# Patient Record
Sex: Male | Born: 1970
Health system: Southern US, Community
[De-identification: ages and names within clinical notes are randomized; demographics above are authoritative.]

---

## 2016-02-21 DIAGNOSIS — L812 Freckles: Secondary | ICD-10-CM | POA: Diagnosis not present

## 2016-02-21 DIAGNOSIS — L219 Seborrheic dermatitis, unspecified: Secondary | ICD-10-CM | POA: Diagnosis not present

## 2016-02-21 DIAGNOSIS — L84 Corns and callosities: Secondary | ICD-10-CM | POA: Diagnosis not present

## 2016-02-21 DIAGNOSIS — L700 Acne vulgaris: Secondary | ICD-10-CM | POA: Diagnosis not present

## 2016-02-21 DIAGNOSIS — L821 Other seborrheic keratosis: Secondary | ICD-10-CM | POA: Diagnosis not present

## 2016-03-24 DIAGNOSIS — K219 Gastro-esophageal reflux disease without esophagitis: Secondary | ICD-10-CM | POA: Diagnosis not present

## 2016-04-13 DIAGNOSIS — K219 Gastro-esophageal reflux disease without esophagitis: Secondary | ICD-10-CM | POA: Diagnosis not present

## 2016-06-21 ENCOUNTER — Other Ambulatory Visit: Payer: Self-pay | Admitting: Gastroenterology

## 2016-06-21 DIAGNOSIS — R1013 Epigastric pain: Secondary | ICD-10-CM

## 2016-06-30 ENCOUNTER — Ambulatory Visit
Admission: RE | Admit: 2016-06-30 | Discharge: 2016-06-30 | Disposition: A | Discharge status: Home / Self Care | Payer: BLUE CROSS/BLUE SHIELD | Source: Ambulatory Visit | Attending: Gastroenterology | Admitting: Gastroenterology

## 2016-06-30 DIAGNOSIS — R1013 Epigastric pain: Secondary | ICD-10-CM | POA: Diagnosis not present

## 2016-06-30 IMAGING — RF DG UGI W/ HIGH DENSITY W/KUB
19 of 24 series · 19 of 24 positions shown · non-contrast
Comparison: None.

CLINICAL DATA: Epigastric pain

EXAM:
UPPER GI SERIES WITH KUB
TECHNIQUE: After obtaining a scout radiograph a routine upper GI series was
performed using thin and high density barium.
FLUOROSCOPY TIME:  Radiation Exposure Index (as provided by the
fluoroscopic device): 130 through d Gy cm2
If the device does not provide the exposure index:
Fluoroscopy Time (in minutes and seconds):  2 minutes 40 seconds
Number of Acquired Images:  Days

[Series 1: run · 1 of 5 slices shown (1 of 19)]
[im 1/5]
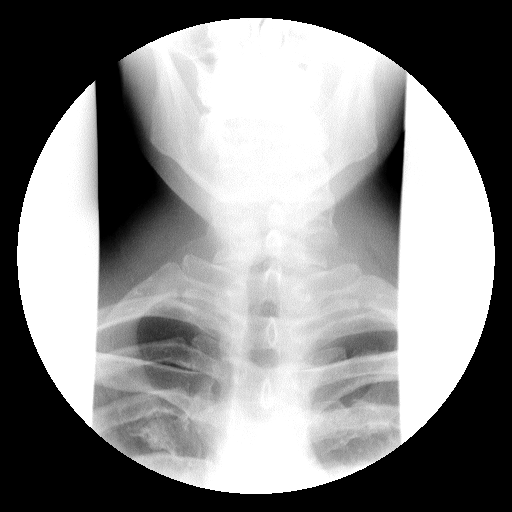

[Series 2: run · 1 of 4 slices shown (2 of 19)]
[im 1/4]
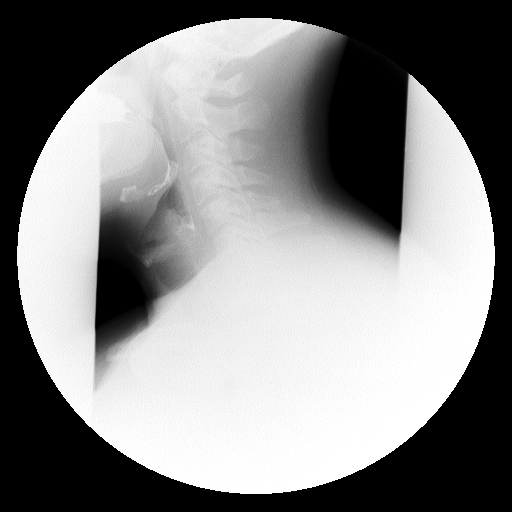

[Series 4: run · 1 of 1 slices shown (3 of 19)]
[im 1/1]
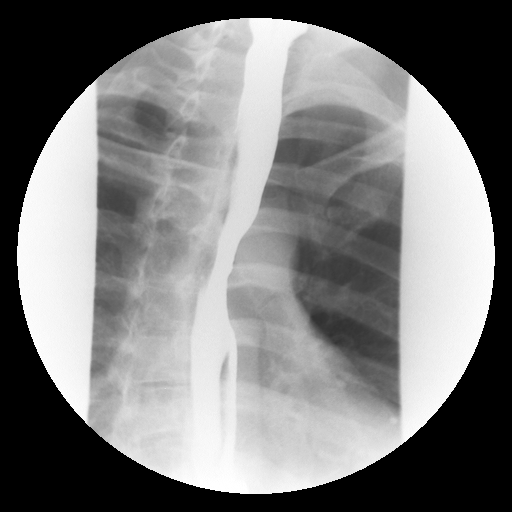

[Series 5: run · 1 of 1 slices shown (4 of 19)]
[im 1/1]
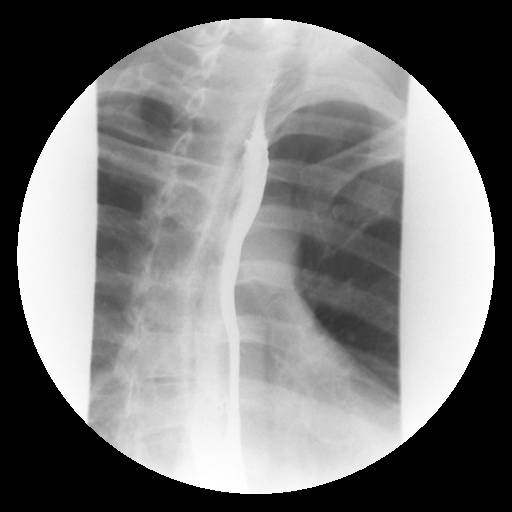

[Series 6: run · 1 of 1 slices shown (5 of 19)]
[im 1/1]
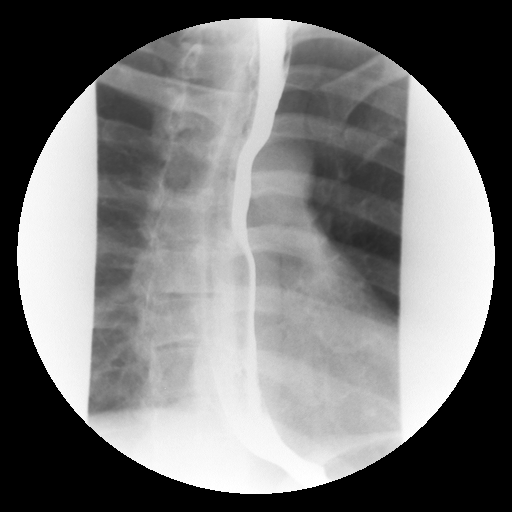

[Series 7: run · 1 of 1 slices shown (6 of 19)]
[im 1/1]
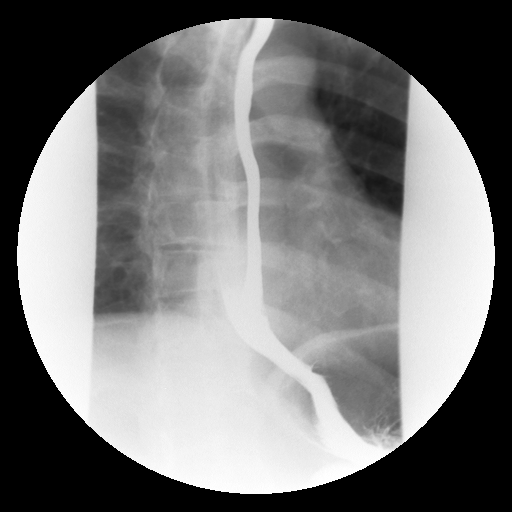

[Series 9: run · 1 of 1 slices shown (7 of 19)]
[im 1/1]
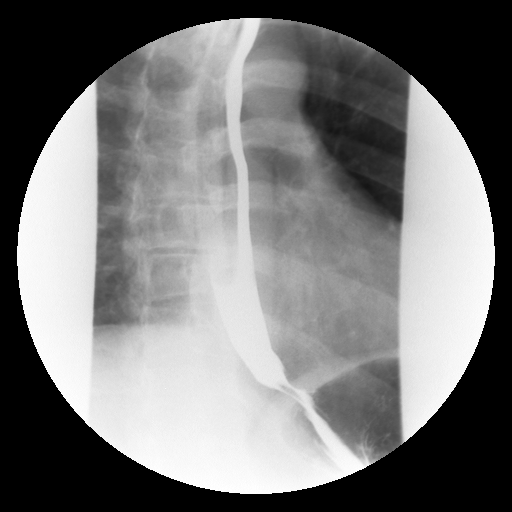

[Series 10: run · 1 of 1 slices shown (8 of 19)]
[im 1/1]
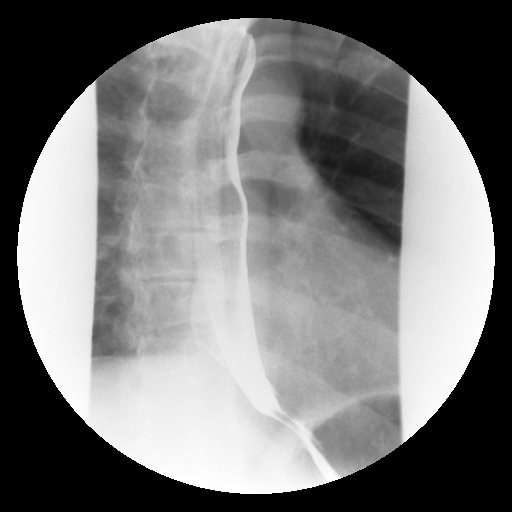

[Series 11: run · 1 of 1 slices shown (9 of 19)]
[im 1/1]
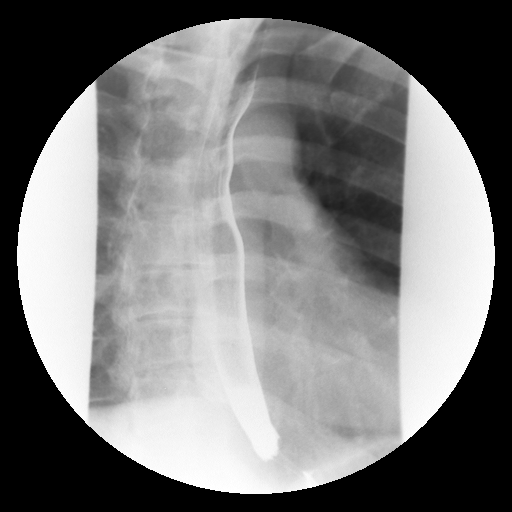

[Series 13: run · 1 of 1 slices shown (10 of 19)]
[im 1/1]
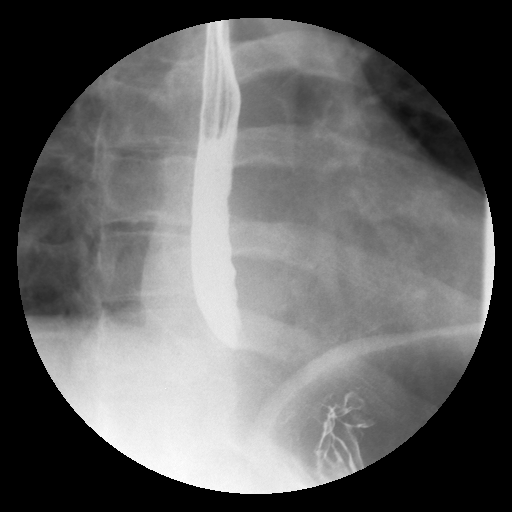

[Series 14: run · 1 of 1 slices shown (11 of 19)]
[im 1/1]
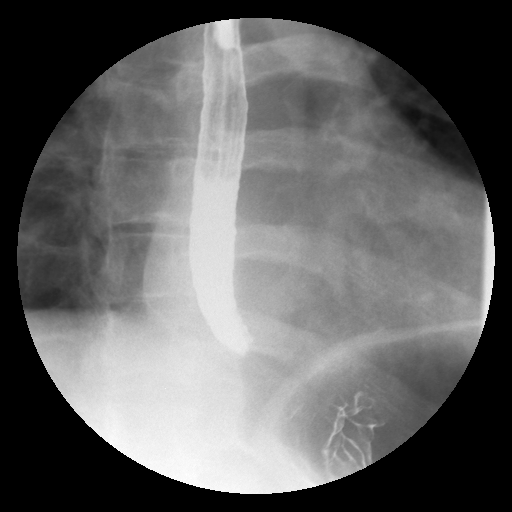

[Series 15: run · 1 of 2 slices shown (12 of 19)]
[im 1/2]
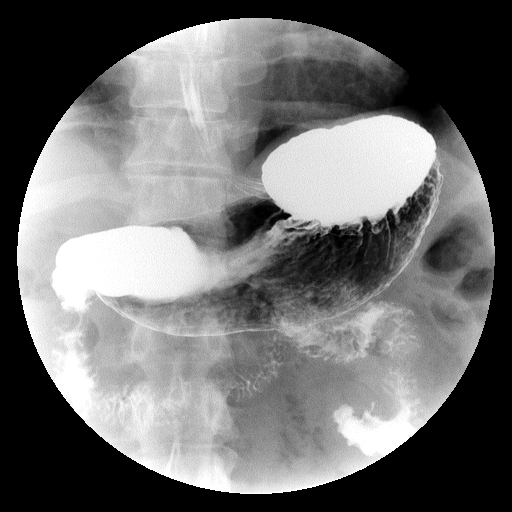

[Series 16: run · 1 of 1 slices shown (13 of 19)]
[im 1/1]
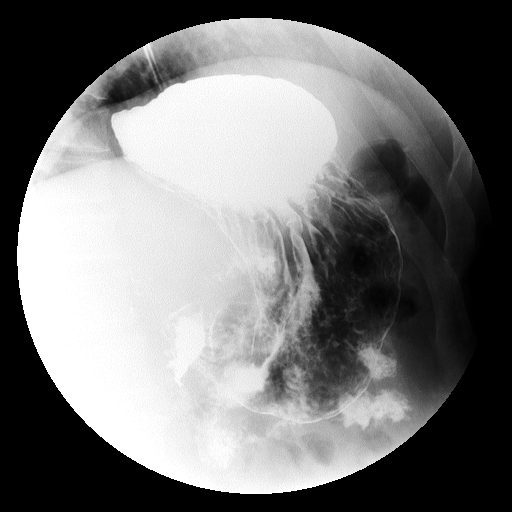

[Series 18: run · 1 of 1 slices shown (14 of 19)]
[im 1/1]
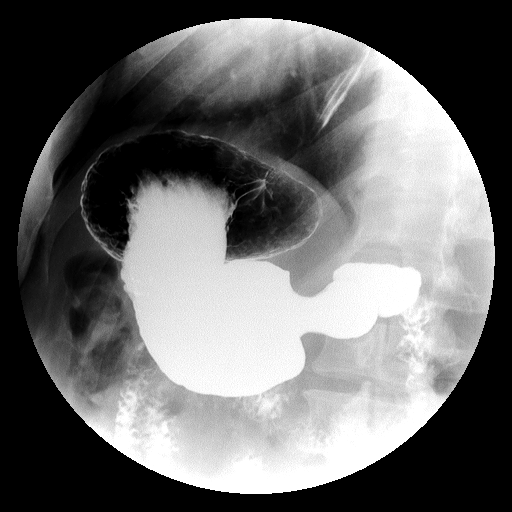

[Series 19: run · 1 of 1 slices shown (15 of 19)]
[im 1/1]
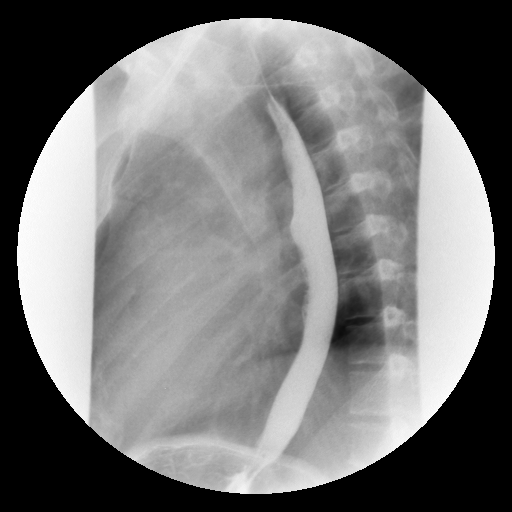

[Series 20: run · 1 of 1 slices shown (16 of 19)]
[im 1/1]
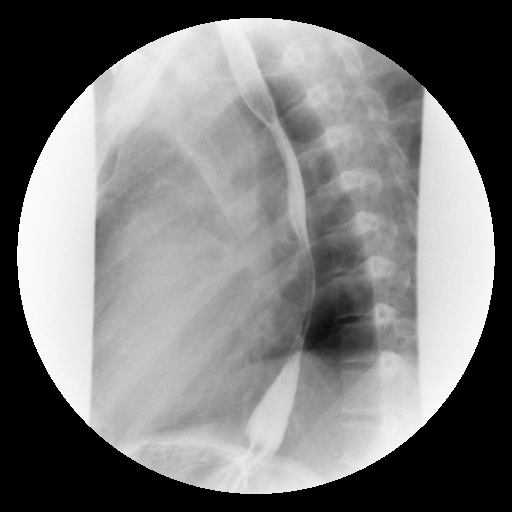

[Series 21: run · 1 of 1 slices shown (17 of 19)]
[im 1/1]
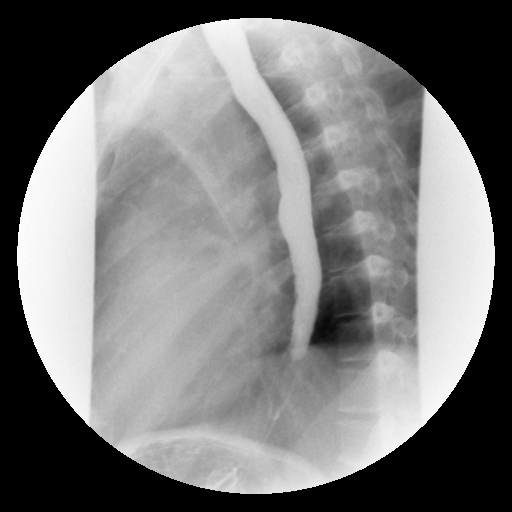

[Series 23: run · 1 of 1 slices shown (18 of 19)]
[im 1/1]
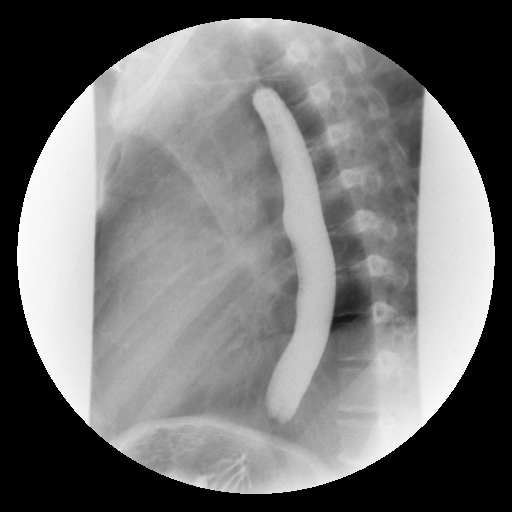

[Series 24: run · 1 of 1 slices shown (19 of 19)]
[im 1/1]
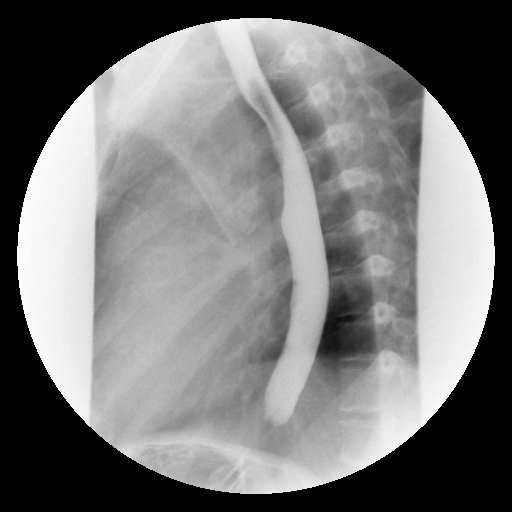

[19 of 24 positions shown; findings below may reference images not displayed]

FINDINGS: There is disruption [DATE] primary esophageal peristaltic
waves. No fixed stricture, fold thickening or mass. No reflux with
the water siphon maneuver. The patient swallowed a 13 mm barium
tablet which freely passed into the stomach.

The stomach, duodenal bulb and duodenal sweep are unremarkable. No
fold thickening, mass or ulceration.
IMPRESSION: Nonspecific esophageal motility disorder.

Otherwise unremarkable upper GI with barium swallow.

## 2017-02-06 DIAGNOSIS — R12 Heartburn: Secondary | ICD-10-CM | POA: Diagnosis not present

## 2017-02-06 DIAGNOSIS — Z0000 Encounter for general adult medical examination without abnormal findings: Secondary | ICD-10-CM | POA: Diagnosis not present

## 2017-02-06 DIAGNOSIS — Z13220 Encounter for screening for lipoid disorders: Secondary | ICD-10-CM | POA: Diagnosis not present

## 2017-02-21 DIAGNOSIS — L821 Other seborrheic keratosis: Secondary | ICD-10-CM | POA: Diagnosis not present

## 2017-02-21 DIAGNOSIS — R1013 Epigastric pain: Secondary | ICD-10-CM | POA: Diagnosis not present

## 2017-02-21 DIAGNOSIS — K219 Gastro-esophageal reflux disease without esophagitis: Secondary | ICD-10-CM | POA: Diagnosis not present

## 2017-02-21 DIAGNOSIS — L814 Other melanin hyperpigmentation: Secondary | ICD-10-CM | POA: Diagnosis not present

## 2017-02-21 DIAGNOSIS — D225 Melanocytic nevi of trunk: Secondary | ICD-10-CM | POA: Diagnosis not present

## 2017-02-23 DIAGNOSIS — K219 Gastro-esophageal reflux disease without esophagitis: Secondary | ICD-10-CM | POA: Diagnosis not present

## 2017-02-23 DIAGNOSIS — R1013 Epigastric pain: Secondary | ICD-10-CM | POA: Diagnosis not present

## 2018-02-21 DIAGNOSIS — S46812A Strain of other muscles, fascia and tendons at shoulder and upper arm level, left arm, initial encounter: Secondary | ICD-10-CM | POA: Diagnosis not present

## 2018-02-21 DIAGNOSIS — Z0000 Encounter for general adult medical examination without abnormal findings: Secondary | ICD-10-CM | POA: Diagnosis not present

## 2018-02-21 DIAGNOSIS — Z91038 Other insect allergy status: Secondary | ICD-10-CM | POA: Diagnosis not present

## 2018-02-21 DIAGNOSIS — Z13220 Encounter for screening for lipoid disorders: Secondary | ICD-10-CM | POA: Diagnosis not present

## 2018-03-13 DIAGNOSIS — S46812D Strain of other muscles, fascia and tendons at shoulder and upper arm level, left arm, subsequent encounter: Secondary | ICD-10-CM | POA: Diagnosis not present

## 2018-03-13 DIAGNOSIS — M546 Pain in thoracic spine: Secondary | ICD-10-CM | POA: Diagnosis not present

## 2018-03-14 DIAGNOSIS — D1801 Hemangioma of skin and subcutaneous tissue: Secondary | ICD-10-CM | POA: Diagnosis not present

## 2018-03-14 DIAGNOSIS — L814 Other melanin hyperpigmentation: Secondary | ICD-10-CM | POA: Diagnosis not present

## 2018-03-14 DIAGNOSIS — L819 Disorder of pigmentation, unspecified: Secondary | ICD-10-CM | POA: Diagnosis not present

## 2018-03-14 DIAGNOSIS — L570 Actinic keratosis: Secondary | ICD-10-CM | POA: Diagnosis not present

## 2018-03-14 DIAGNOSIS — L738 Other specified follicular disorders: Secondary | ICD-10-CM | POA: Diagnosis not present

## 2018-03-22 DIAGNOSIS — S46812D Strain of other muscles, fascia and tendons at shoulder and upper arm level, left arm, subsequent encounter: Secondary | ICD-10-CM | POA: Diagnosis not present

## 2018-03-22 DIAGNOSIS — M546 Pain in thoracic spine: Secondary | ICD-10-CM | POA: Diagnosis not present

## 2018-03-29 DIAGNOSIS — M546 Pain in thoracic spine: Secondary | ICD-10-CM | POA: Diagnosis not present

## 2018-03-29 DIAGNOSIS — S46812D Strain of other muscles, fascia and tendons at shoulder and upper arm level, left arm, subsequent encounter: Secondary | ICD-10-CM | POA: Diagnosis not present

## 2018-04-10 DIAGNOSIS — M546 Pain in thoracic spine: Secondary | ICD-10-CM | POA: Diagnosis not present

## 2018-04-10 DIAGNOSIS — S46812D Strain of other muscles, fascia and tendons at shoulder and upper arm level, left arm, subsequent encounter: Secondary | ICD-10-CM | POA: Diagnosis not present

## 2018-04-17 DIAGNOSIS — S46812D Strain of other muscles, fascia and tendons at shoulder and upper arm level, left arm, subsequent encounter: Secondary | ICD-10-CM | POA: Diagnosis not present

## 2018-04-17 DIAGNOSIS — M546 Pain in thoracic spine: Secondary | ICD-10-CM | POA: Diagnosis not present

## 2018-05-01 DIAGNOSIS — S46812D Strain of other muscles, fascia and tendons at shoulder and upper arm level, left arm, subsequent encounter: Secondary | ICD-10-CM | POA: Diagnosis not present

## 2018-05-01 DIAGNOSIS — M546 Pain in thoracic spine: Secondary | ICD-10-CM | POA: Diagnosis not present

## 2018-05-08 DIAGNOSIS — S46812D Strain of other muscles, fascia and tendons at shoulder and upper arm level, left arm, subsequent encounter: Secondary | ICD-10-CM | POA: Diagnosis not present

## 2018-05-08 DIAGNOSIS — M546 Pain in thoracic spine: Secondary | ICD-10-CM | POA: Diagnosis not present

## 2018-05-15 DIAGNOSIS — S46812D Strain of other muscles, fascia and tendons at shoulder and upper arm level, left arm, subsequent encounter: Secondary | ICD-10-CM | POA: Diagnosis not present

## 2018-05-15 DIAGNOSIS — M546 Pain in thoracic spine: Secondary | ICD-10-CM | POA: Diagnosis not present

## 2018-05-28 DIAGNOSIS — M546 Pain in thoracic spine: Secondary | ICD-10-CM | POA: Diagnosis not present

## 2018-05-28 DIAGNOSIS — S46812D Strain of other muscles, fascia and tendons at shoulder and upper arm level, left arm, subsequent encounter: Secondary | ICD-10-CM | POA: Diagnosis not present

## 2018-05-31 ENCOUNTER — Ambulatory Visit: Payer: BLUE CROSS/BLUE SHIELD | Admitting: Family Medicine

## 2018-06-14 ENCOUNTER — Ambulatory Visit: Payer: BLUE CROSS/BLUE SHIELD | Admitting: Family Medicine

## 2018-06-14 VITALS — BP 126/82 | Ht 71.0 in | Wt 195.0 lb

## 2018-06-14 DIAGNOSIS — M7918 Myalgia, other site: Secondary | ICD-10-CM

## 2018-06-14 MED ORDER — METHYLPREDNISOLONE ACETATE 40 MG/ML IJ SUSP
40.0000 mg | Freq: Once | INTRAMUSCULAR | Status: AC
  Administered 2018-06-14: 40 mg via INTRA_ARTICULAR

## 2018-06-14 NOTE — Progress Notes (Signed)
    CHIEF COMPLAINT / HPI: Left upper back pain. Several months. Possibly started after he did some "hangs" from horizontal bar. No regular work outs in last year except walking. Pain is worst at night --has difficulty getting comfortable. Discomfort is in area over left rhomboid. Burns and is a little sore. No radiation. Repositioning sometimes makes it better. Has been doing PT with Fredia BeetsJohn O'Haloran. No specific diagnosis. Has had massage tehrapy. No significnat improvement with either. His pt recommended injeciton therapy or dry needling.  REVIEW OF SYSTEMS: No fever, no unusual weight change. No upper extremity weakness or numbness. No skin rash.  PERTINENT  PMH / PSH: I have reviewed the patient's medications, allergies, past medical and surgical history, smoking status and updated in the EMR as appropriate.   OBJECTIVE:  GEN WD WN NAD Back: area of discomfort is over left rhomboid/ medial scaplar border. Insertion of levator scapulae is not ttp.  Scapula: symmetrical with no winging SKIN; no rash, no lesions, no unusual warmth or erythema Vasc: Radial pulse 2+B=.  PROCEDURE: INJECTION: Patient was given informed consent, signed copy in the chart. Appropriate time out was taken. Area prepped and draped in usual sterile fashion. Ethyl chloride was  used for local anesthesia. A 21 gauge 1 1/2 inch needle was used.. 1 cc of methylprednisolone 40 mg/ml plus 3 cc of 1% lidocaine without epinephrine was injected into the area of tenderness over left rhomn]boid using a(n) fan shaped approach with three injections into the mucle.   The patient tolerated the procedure well. There were no complications. Post procedure instructions were given.  ASSESSMENT / PLAN:  Left rhomboid muscle pain, chronic. Has done extensove Pt. Will tyr trigger point injection today and if no improvement will rtc 3-4 weeks. Would consider NTG therpay at that time. Dount any imagingis going t be beneficial

## 2018-06-20 DIAGNOSIS — L570 Actinic keratosis: Secondary | ICD-10-CM | POA: Diagnosis not present

## 2018-08-02 DIAGNOSIS — L570 Actinic keratosis: Secondary | ICD-10-CM | POA: Diagnosis not present

## 2018-08-23 ENCOUNTER — Ambulatory Visit: Payer: BLUE CROSS/BLUE SHIELD | Admitting: Family Medicine

## 2018-08-23 DIAGNOSIS — G8929 Other chronic pain: Secondary | ICD-10-CM

## 2018-08-23 DIAGNOSIS — M898X1 Other specified disorders of bone, shoulder: Secondary | ICD-10-CM

## 2018-08-23 MED ORDER — NITROGLYCERIN 0.2 MG/HR TD PT24: MEDICATED_PATCH | TRANSDERMAL | 1 refills | Status: AC

## 2018-08-23 NOTE — Patient Instructions (Signed)
Cut patch into one - fourth pieces Place a one fourth piece of patch on  skin over affected area, changing to a new piece every 24 hours.   You may experience a headache during the first 1-2 days and maybe up to 2 weeks of using the patch; these should improve and go away. If you experience headaches after beginning nitroglycerin patch treatment, you may take your preferred over the counter pain medicine such as tylenol or advil as directed on the box. Another side effect of the nitroglycerin patch can be skin irritation  or rash related to the patch adhesive.   Please notify our office if you develop  severe headaches or rash, and stop the patch.  Please call our office with any questions or problems. Tendon healing with nitroglycerin patch may require 12 to 24 weeks depending on the extent of injury. Men should not use if taking Viagra, Cialis, or Levitra as it may cause an unsafe lowering of the blood pressure..  Use with caution if you have migraines or rosacea.  I would recommend trying it for 3-4 weeks, maybe add some exercises as we discussed, and then get back with me.

## 2018-08-24 ENCOUNTER — Encounter: Payer: Self-pay | Admitting: Family Medicine

## 2018-08-24 DIAGNOSIS — M898X1 Other specified disorders of bone, shoulder: Principal | ICD-10-CM

## 2018-08-24 DIAGNOSIS — G8929 Other chronic pain: Secondary | ICD-10-CM | POA: Insufficient documentation

## 2018-08-24 NOTE — Assessment & Plan Note (Signed)
LEFT Discussed options including further imaging (MRI of scapular area looking for ganglion cyst / evidence of nerve injury / chronic levator pathology etc, and could also consider MRI of neck looking for occult issues; neck MRI would be much less of a priority as he has no overt neck symptoms but will keep on differential dx list) Will start NTG patch therapy. Discussed finding an exercise or two that seemed to "work" that area--such as seated row, overhead press, push up narrow hand stance etc)  F/u either w ov or via phone in 3-4 weeks and we can consider next steps. Greater than 50% of our 25 minute office visit was spent in counseling and education regarding these issues.

## 2018-08-24 NOTE — Progress Notes (Addendum)
  Steven Travis - 47 y.o. male MRN [id number]  Date of birth: April 24, 1971    SUBJECTIVE:      Chief Complaint:/ HPI:  Follow-up left posterior shoulder pain and discomfort.  At last office visit gave him trigger point injections.  He had improvement for the first 3 to 4 weeks then returned to baseline.  Is frustrated.  Pain suprascapular left side, nagging aching.  Keeps him awake at night.  Nothing seems to help.  No particular activity but worse but it gets worse throughout the day.  No numbness in the upper extremity.  No weakness in shoulder arm function noted.    ROS:     See HPI.  Additional pertinent review of systems is negative for fever.  No unusual weight change, no rash or lesions noted in the left shoulder or left upper extremity area.  No other unusual myalgias.  PERTINENT  PMH / PSH FH / / SH:  Past Medical, Surgical, Social, and Family History Reviewed & Updated in the EMR.  Pertinent findings include:  Non-smoker No personal history diabetes mellitus  OBJECTIVE: BP 130/82   Ht 5\' 11"  (1.803 m)   Wt 195 lb (88.5 kg)   BMI 27.20 kg/m   Physical Exam:  Vital signs are reviewed. GENERAL: Well-developed male no acute distress Shoulder: Bilaterally they are symmetrical.  Full range of motion in all planes the rotator cuff with normal strength bilaterally.  Tender to palpation over the superior border of the left scapula and this reproduces his pain to some extent although there is no specific trigger area noted today.  Scapular retraction is symmetrical.  There is no scapular winging. VASCULAR: Radial pulses 2+ bilateral stool NEURO: Intact sensation soft touch bilateral upper extremity.  C4-5 and 6 strength intact bilaterally.    NECK: Full range of motion no pain with Spurling's.  ASSESSMENT & PLAN:  See problem based charting & AVS for pt instructions.

## 2019-05-29 DIAGNOSIS — S46812D Strain of other muscles, fascia and tendons at shoulder and upper arm level, left arm, subsequent encounter: Secondary | ICD-10-CM | POA: Diagnosis not present

## 2019-05-29 DIAGNOSIS — H9313 Tinnitus, bilateral: Secondary | ICD-10-CM | POA: Diagnosis not present

## 2019-05-29 DIAGNOSIS — M5412 Radiculopathy, cervical region: Secondary | ICD-10-CM | POA: Diagnosis not present

## 2019-06-13 ENCOUNTER — Ambulatory Visit: Payer: BC Managed Care – PPO | Admitting: Family Medicine

## 2019-06-13 ENCOUNTER — Encounter: Payer: Self-pay | Admitting: Family Medicine

## 2019-06-13 ENCOUNTER — Other Ambulatory Visit: Payer: Self-pay

## 2019-06-13 ENCOUNTER — Ambulatory Visit
Admission: RE | Admit: 2019-06-13 | Discharge: 2019-06-13 | Disposition: A | Discharge status: Home / Self Care | Payer: BC Managed Care – PPO | Source: Ambulatory Visit | Attending: Family Medicine | Admitting: Family Medicine

## 2019-06-13 VITALS — BP 126/82 | Ht 71.0 in | Wt 195.0 lb

## 2019-06-13 DIAGNOSIS — M79609 Pain in unspecified limb: Secondary | ICD-10-CM

## 2019-06-13 DIAGNOSIS — G8929 Other chronic pain: Secondary | ICD-10-CM

## 2019-06-13 DIAGNOSIS — M898X1 Other specified disorders of bone, shoulder: Secondary | ICD-10-CM

## 2019-06-13 DIAGNOSIS — R202 Paresthesia of skin: Secondary | ICD-10-CM

## 2019-06-13 DIAGNOSIS — M5033 Other cervical disc degeneration, cervicothoracic region: Secondary | ICD-10-CM | POA: Diagnosis not present

## 2019-06-13 IMAGING — CR CERVICAL SPINE - 2-3 VIEW
3 series · 3 of 3 positions shown · non-contrast
Comparison: None.

CLINICAL DATA: Cervicalgia

EXAM:
CERVICAL SPINE - 2-3 VIEW

[w c-spine lat]
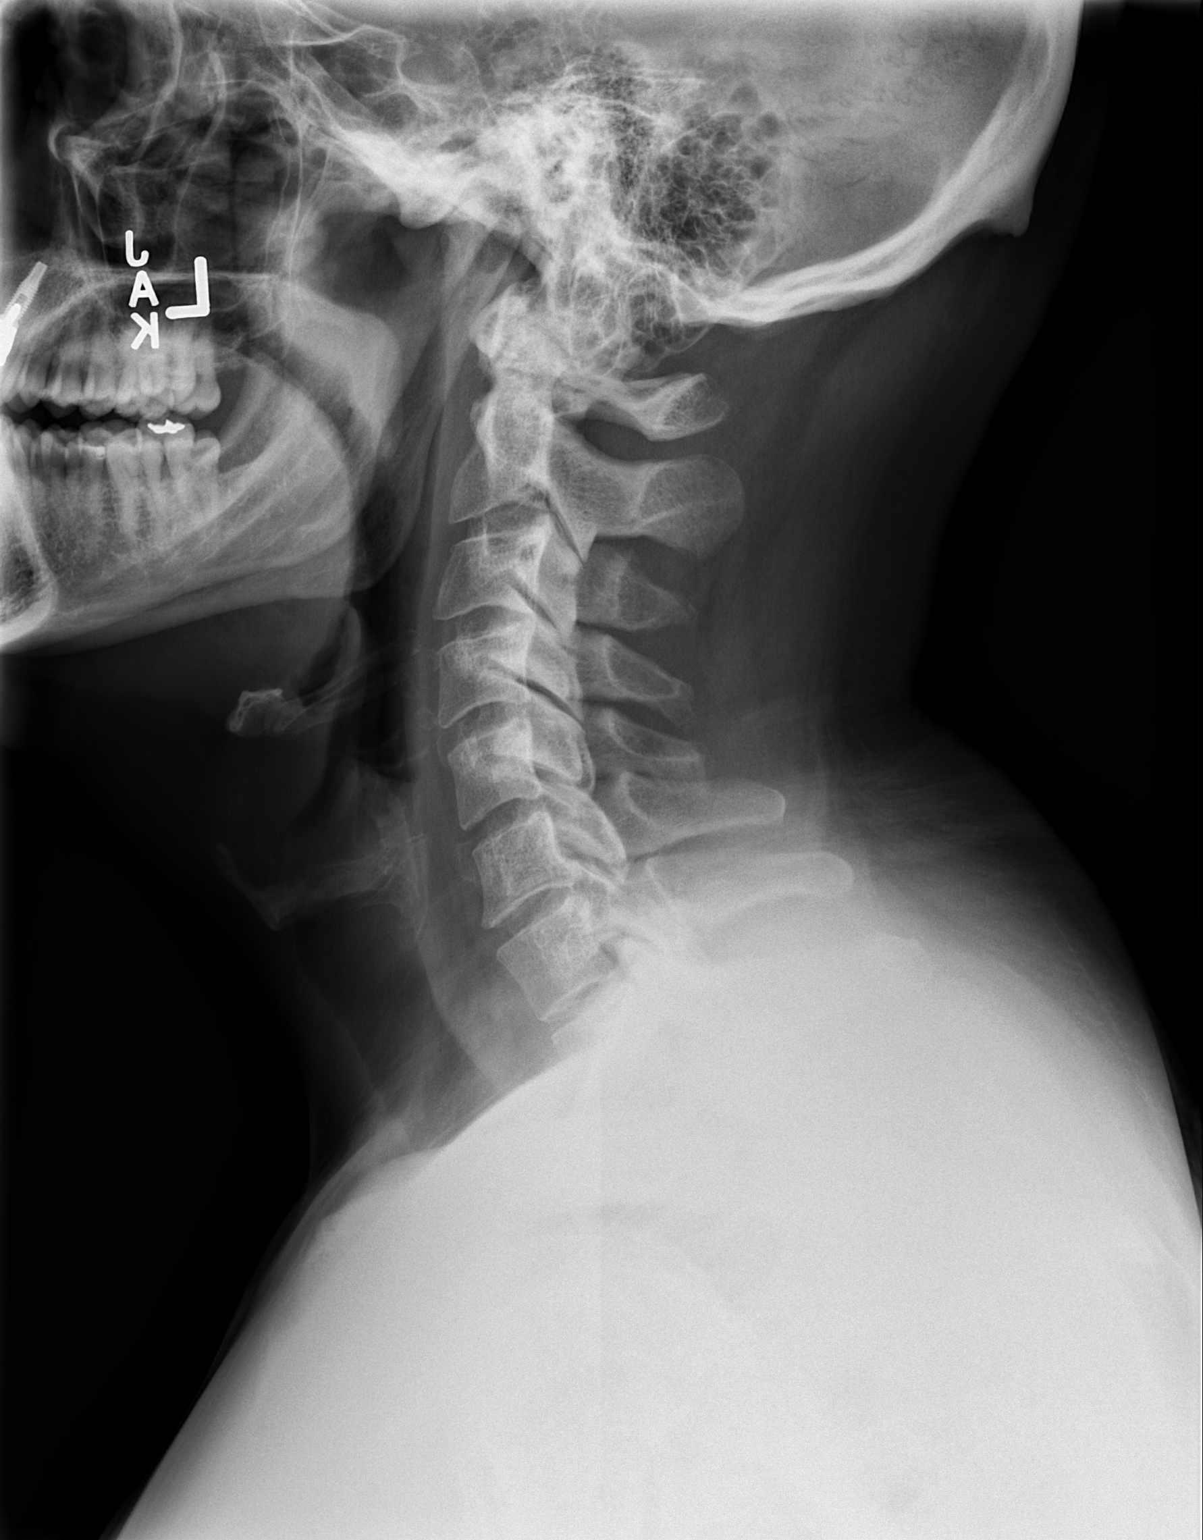

[w c-spine a.p. *]
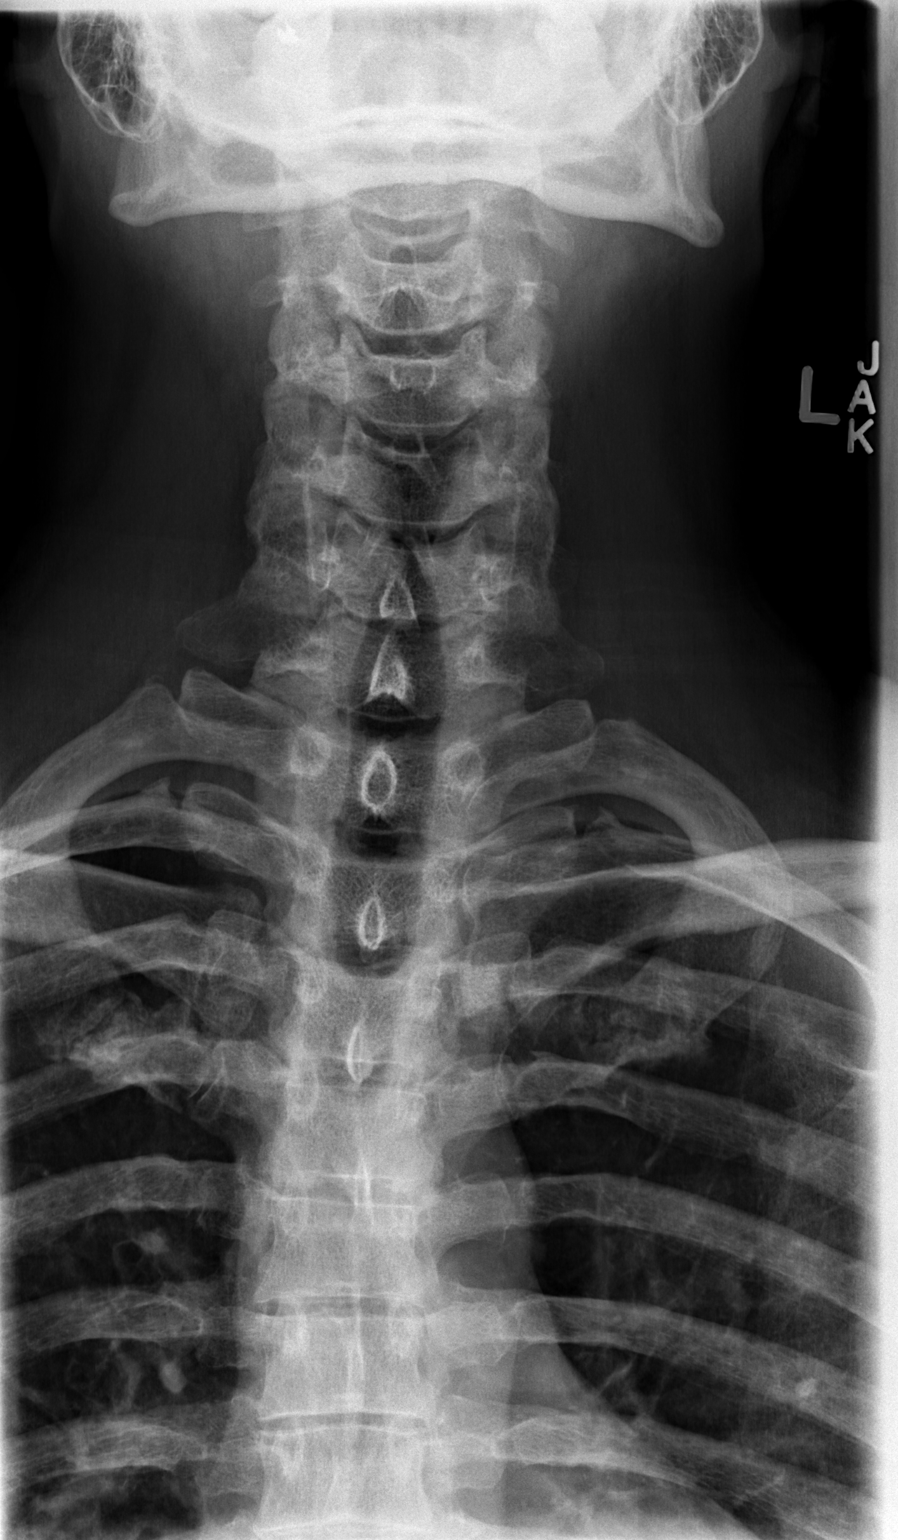

[w c-spine odontoid *]
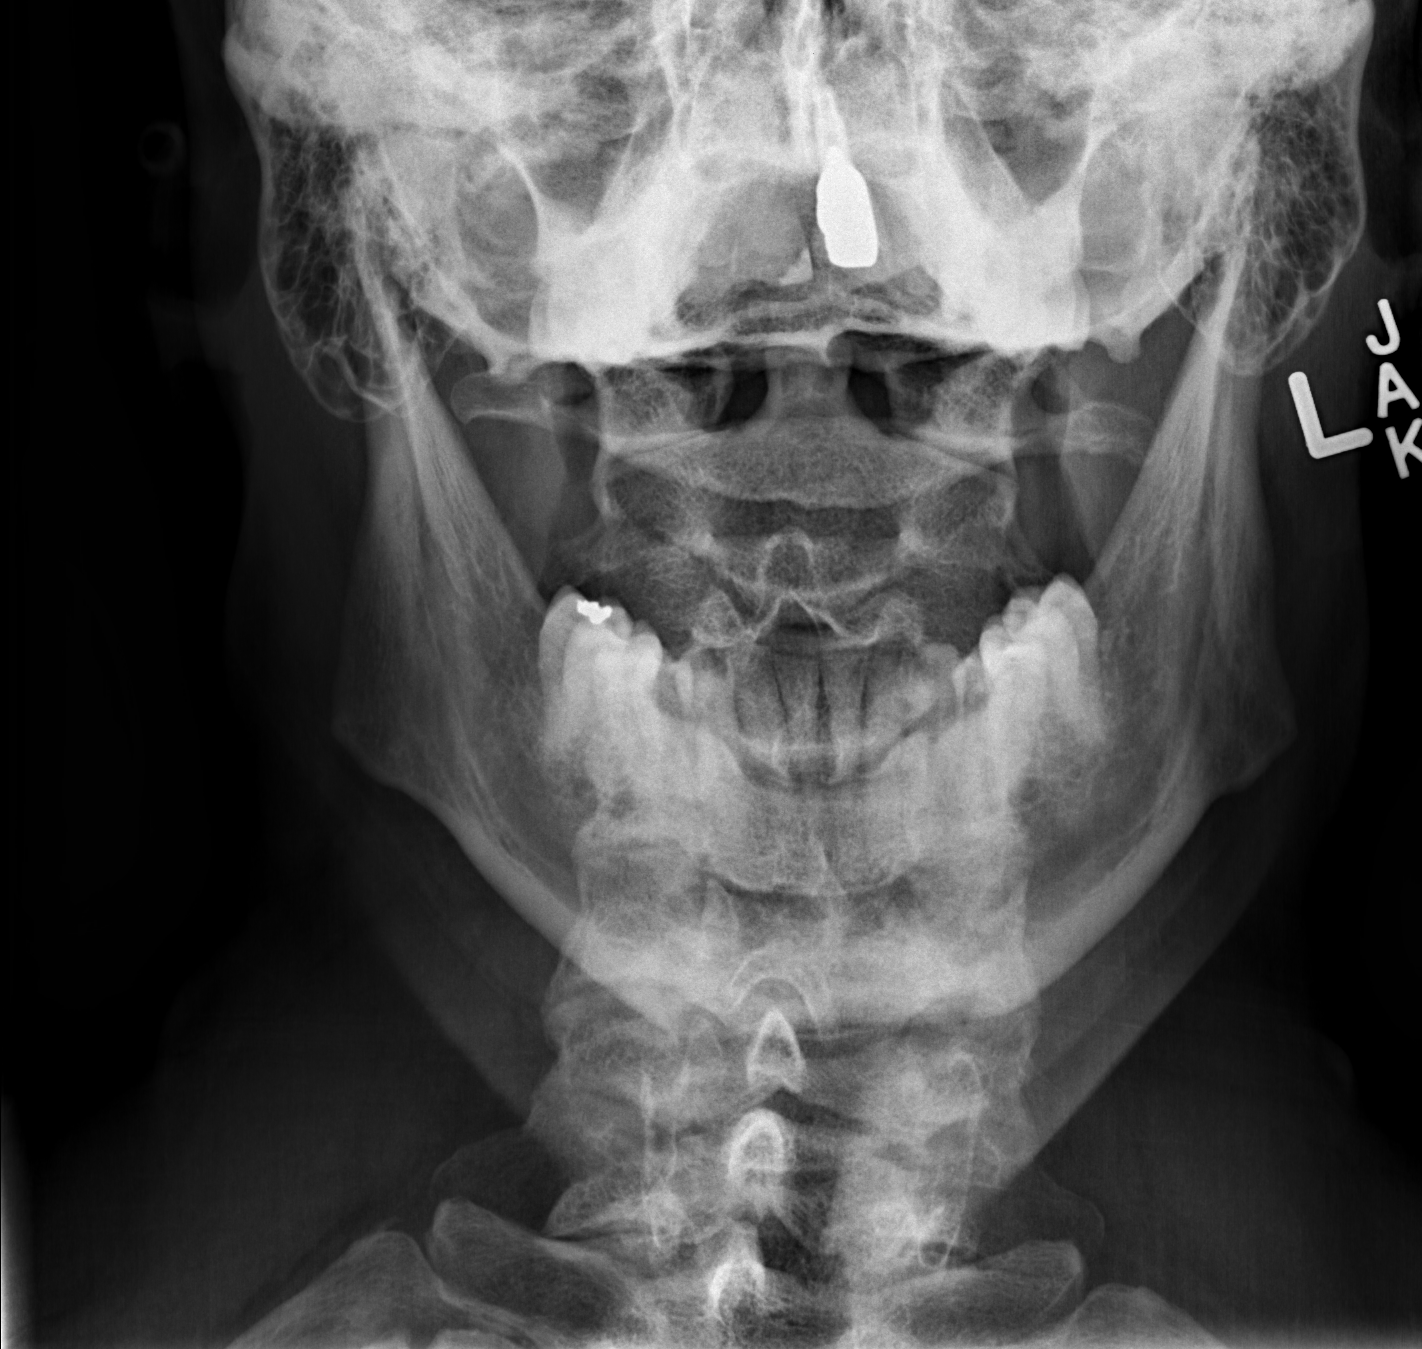

[3 of 3 positions shown; findings below may reference images not displayed]

FINDINGS: Frontal, lateral, and open-mouth odontoid images were obtained.
There is no fracture or spondylolisthesis. Prevertebral soft tissues
and predental space regions are normal. There is moderate disc space
narrowing at C7-T1. There is slight disc space narrowing at C6-7. No
erosive changes. Lung apices are clear.
IMPRESSION: Moderate disc space narrowing at C7-T1 with slight narrowing at
C6-7. Other disc spaces appear unremarkable. No fracture or
spondylolisthesis.

## 2019-06-13 NOTE — Progress Notes (Signed)
PCP: Ileana LaddWong, Francis P, MD  Subjective:   HPI: Patient is a 48 y.o. male here for follow-up of left shoulder pain.  Patient's pain is been present for the last 1 to 2 years.  Seen for shoulder pain in November 2019.  Patient denies any injury or trauma to his shoulder however he notes the pain initially started after stretching during golf.  Patient notes the pain is located on lateral aspect of her shoulder and the posterior aspect of his shoulder.  He does have intermittent numbness and tingling going down his entire left arm.  He denies any radiating pain down his left arm.  Patient does endorse occasional neck pain.  Patient denies any difficulty with overhead activities or reaching behind his back.  Patient has tried formal physical therapy for several months, oral NSAIDs, nitroglycerin patch, dry needling for his pain but has not had any improvement.  Patient has also undergone subacromial injection of his shoulder without improvement.   Review of Systems: See HPI above.  History reviewed. No pertinent past medical history.  Current Outpatient Medications on File Prior to Visit  Medication Sig Dispense Refill  . nitroGLYCERIN (NITRODUR - DOSED IN MG/24 HR) 0.2 mg/hr patch Cut patch into one - fourth pieces Place a one fourth piece of patch on  skin over affected area, changing to a new piece every 24 hours. (Patient not taking: Reported on 06/13/2019) 30 patch 1   No current facility-administered medications on file prior to visit.     History reviewed. No pertinent surgical history.  Allergies  Allergen Reactions  . Bee Venom Anaphylaxis    Social History   Socioeconomic History  . Marital status: Married    Spouse name: Not on file  . Number of children: Not on file  . Years of education: Not on file  . Highest education level: Not on file  Occupational History  . Not on file  Social Needs  . Financial resource strain: Not on file  . Food insecurity    Worry: Not on file     Inability: Not on file  . Transportation needs    Medical: Not on file    Non-medical: Not on file  Tobacco Use  . Smoking status: Never Smoker  . Smokeless tobacco: Never Used  Substance and Sexual Activity  . Alcohol use: Not on file  . Drug use: Not on file  . Sexual activity: Not on file  Lifestyle  . Physical activity    Days per week: Not on file    Minutes per session: Not on file  . Stress: Not on file  Relationships  . Social Musicianconnections    Talks on phone: Not on file    Gets together: Not on file    Attends religious service: Not on file    Active member of club or organization: Not on file    Attends meetings of clubs or organizations: Not on file    Relationship status: Not on file  . Intimate partner violence    Fear of current or ex partner: Not on file    Emotionally abused: Not on file    Physically abused: Not on file    Forced sexual activity: Not on file  Other Topics Concern  . Not on file  Social History Narrative  . Not on file    History reviewed. No pertinent family history.      Objective:  Physical Exam: BP 126/82   Ht 5\' 11"  (1.803 m)  Wt 195 lb (88.5 kg)   BMI 27.20 kg/m  Gen: NAD, comfortable in exam room Lungs: Breathing comfortably on room air Shoulder Exam Left -Inspection: No discoloration, no deformity -Palpation: No tenderness to palpation -ROM (active): Abduction: 180 degrees; Forward Flexion: 180 degrees; Internal Rotation: T10 -ROM (Passive): Abduction: 180 degrees; Forward Flexion: 180 degrees; Internal Rotation: T10 -Strength: Abduction: 5/5; Forward Flexion: 5/5; Internal Rotation: 5/5; External Rotation: 5/5 -Special Tests: Hawkins: Negative; Neers: Negative; Jobs: Negative; O'briens: Negative; Speeds: Negative; Yergasons: Negative; Cross arm: negative; Apprehension: Negative -Limb neurovascularly intact  Contralateral Shoulder -Inspection: No discoloration, no deformity -Palpation: No tenderness to  palpation -ROM (active): Abduction: 180 degrees; Forward Flexion: 180 degrees; Internal Rotation: T10 -ROM (Passive): Abduction: 180 degrees; Forward Flexion: 180 degrees; Internal Rotation: T10 -Strength: Abduction: 5/5; Forward Flexion: 5/5; Internal Rotation: 5/5; External Rotation: 5/5 -Limb neurovascularly intact  Cervical Exam:  -Full range of motion with flexion, extension, lateral rotation.  -Spurlings: Negative    Assessment & Plan:  Patient is a 48 y.o. male here for follow-up of left shoulder pain  1.  Cervical radiculopathy - Patient had normal exam of his shoulder and rotator cuff today - Patient without improvement of his pain with conservative therapy for management of intrinsic shoulder issue -Given his radicular symptoms we will proceed with xrays of the neck and a MRI of the cervical spine  Patient will follow-up after his MRI

## 2019-06-14 NOTE — Progress Notes (Signed)
[facility]: Attending Note: I have reviewed the chart, discussed wit the Sports Medicine Fellow. I agree with assessment and treatment plan as detailed in the Fellow's note.  

## 2019-07-04 DIAGNOSIS — H9313 Tinnitus, bilateral: Secondary | ICD-10-CM | POA: Diagnosis not present

## 2019-07-09 ENCOUNTER — Ambulatory Visit
Admission: RE | Admit: 2019-07-09 | Discharge: 2019-07-09 | Disposition: A | Discharge status: Home / Self Care | Payer: BC Managed Care – PPO | Source: Ambulatory Visit | Attending: Family Medicine | Admitting: Family Medicine

## 2019-07-09 DIAGNOSIS — G8929 Other chronic pain: Secondary | ICD-10-CM

## 2019-07-09 DIAGNOSIS — M79609 Pain in unspecified limb: Secondary | ICD-10-CM

## 2019-07-09 DIAGNOSIS — M4802 Spinal stenosis, cervical region: Secondary | ICD-10-CM | POA: Diagnosis not present

## 2019-07-09 DIAGNOSIS — R202 Paresthesia of skin: Secondary | ICD-10-CM

## 2019-07-09 IMAGING — MR MR CERVICAL SPINE W/O CM
5 series · 36 of 48 positions shown · non-contrast
Comparison: X-ray [DATE]

CLINICAL DATA: Neck pain with left-sided radiculopathy

EXAM:
MRI CERVICAL SPINE WITHOUT CONTRAST
TECHNIQUE: Multiplanar, multisequence MR imaging of the cervical spine was
performed. No intravenous contrast was administered.

[Series 2: T2 · sagittal · 3.0mm · 0.41mm/px · 8 of 17 slices shown (1 of 2)]
[im 1/17]
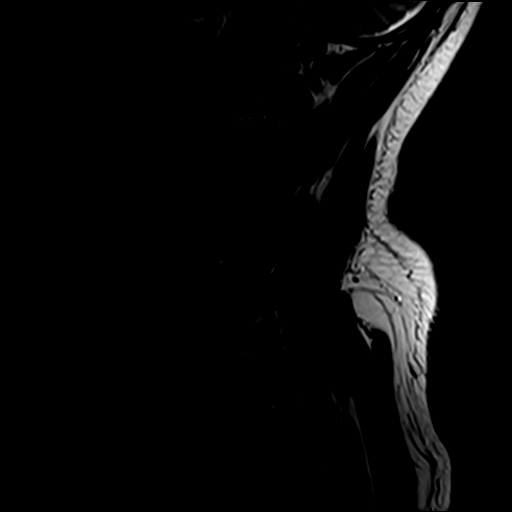
[im 3/17]
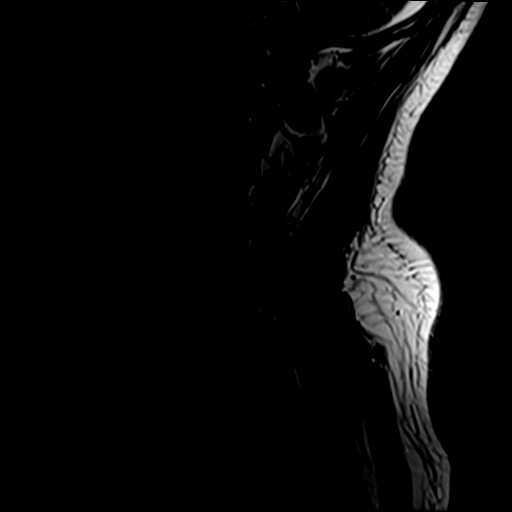
[im 5/17]
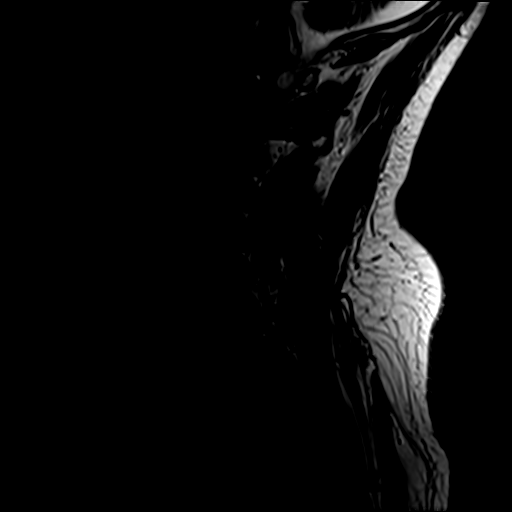
[im 7/17]
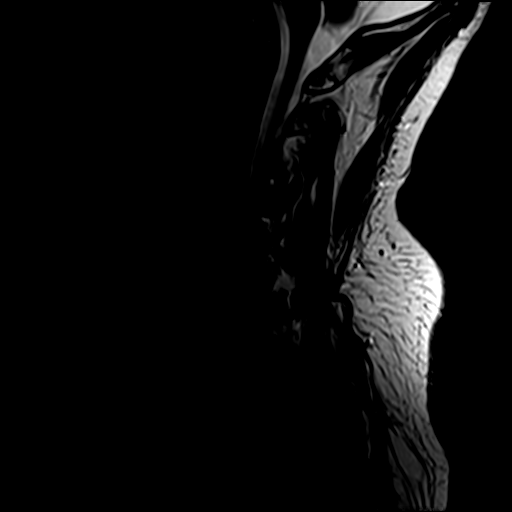
[im 10/17]
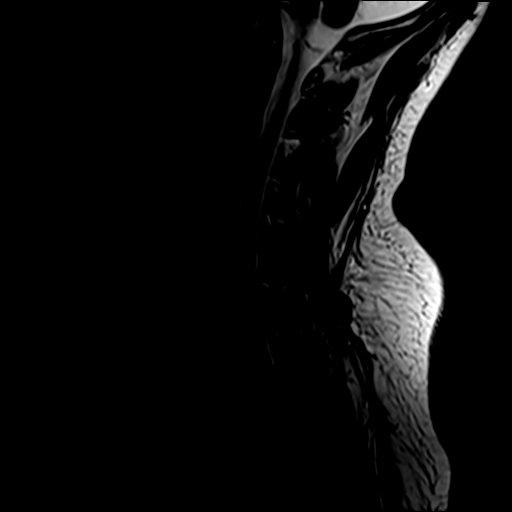
[im 12/17]
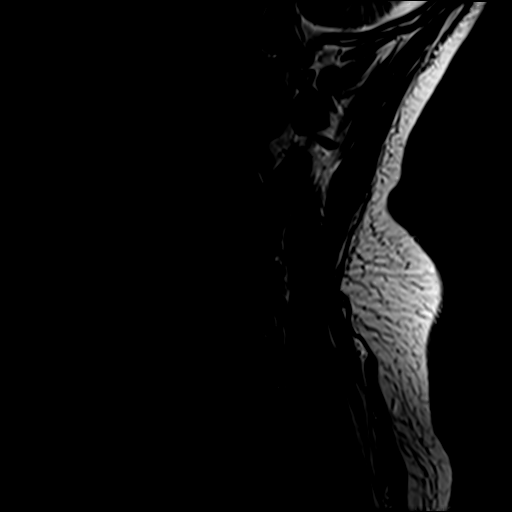
[im 14/17]
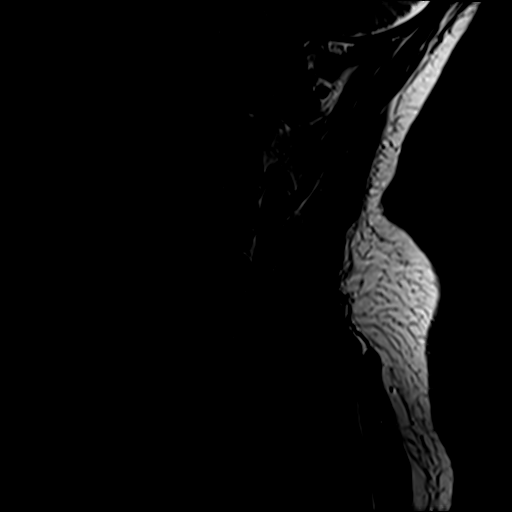
[im 17/17]
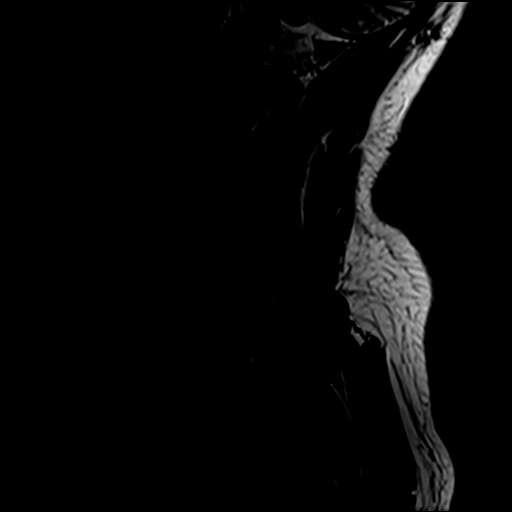

[Series 3: STIR · sagittal · 3.0mm · 0.82mm/px · 8 of 17 slices shown]
[im 1/17]
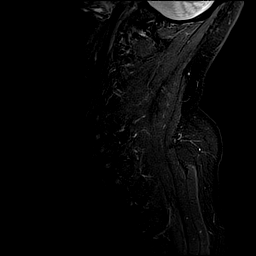
[im 3/17]
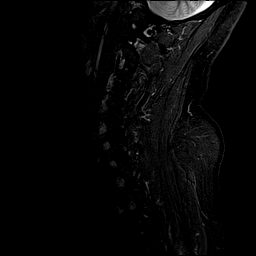
[im 5/17]
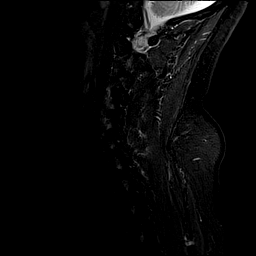
[im 7/17]
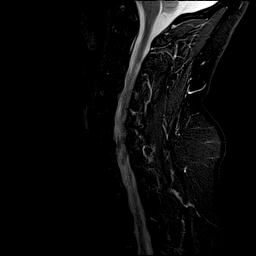
[im 10/17]
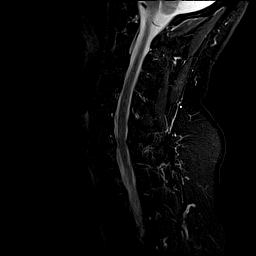
[im 12/17]
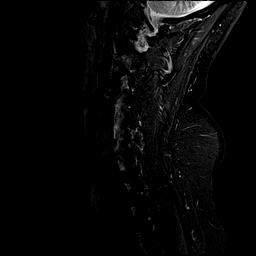
[im 14/17]
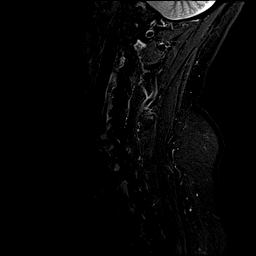
[im 17/17]
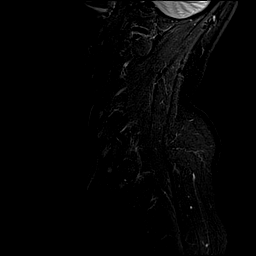

[Series 4: T1 · sagittal · 3.0mm · 0.82mm/px · 8 of 17 slices shown]
[im 1/17]
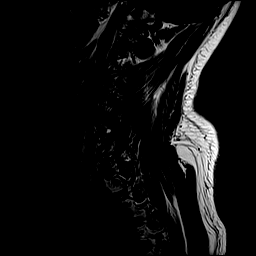
[im 3/17]
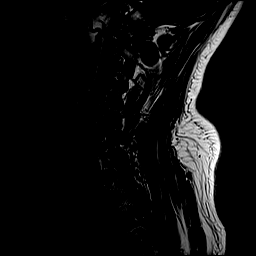
[im 5/17]
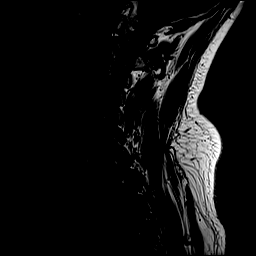
[im 7/17]
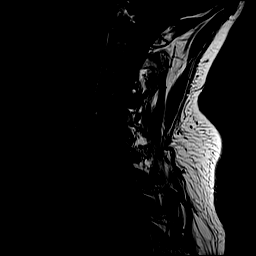
[im 10/17]
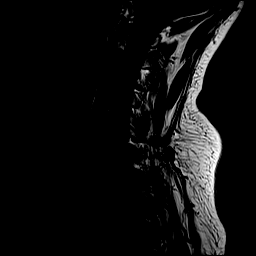
[im 12/17]
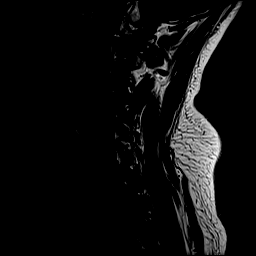
[im 14/17]
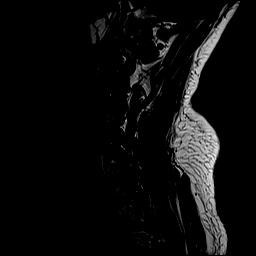
[im 17/17]
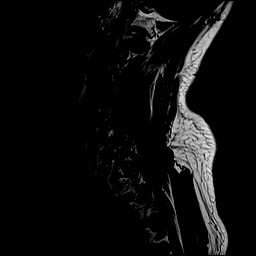

[Series 5: T2 · axial · 3.0mm · 0.70mm/px · z∈[-42,+54]mm · 9 of 27 slices shown (2 of 2)]
[im 1/27]
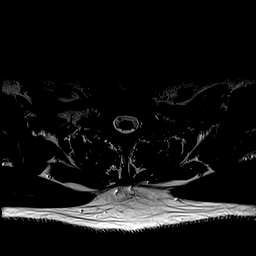
[im 5/27]
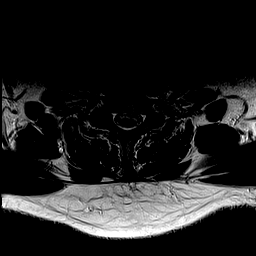
[im 8/27]
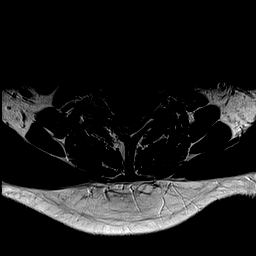
[im 12/27]
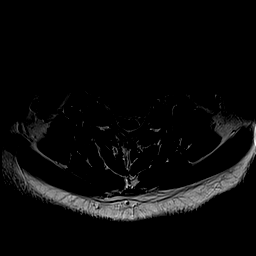
[im 15/27]
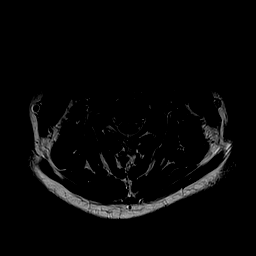
[im 19/27]
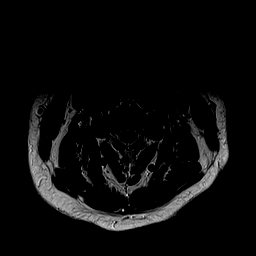
[im 22/27]
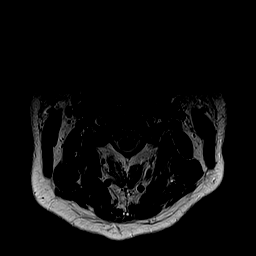
[im 24/27]
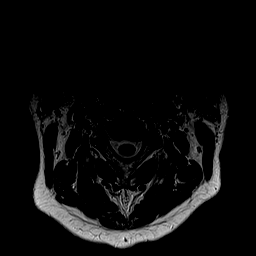
[im 27/27]
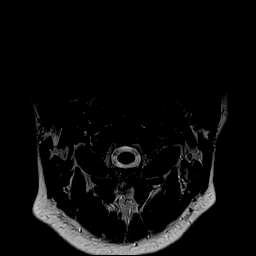

[Series 6: GRE · axial · 3.0mm · 0.35mm/px · z∈[-42,-17]mm · 3 of 27 slices shown]
[im 1/27]
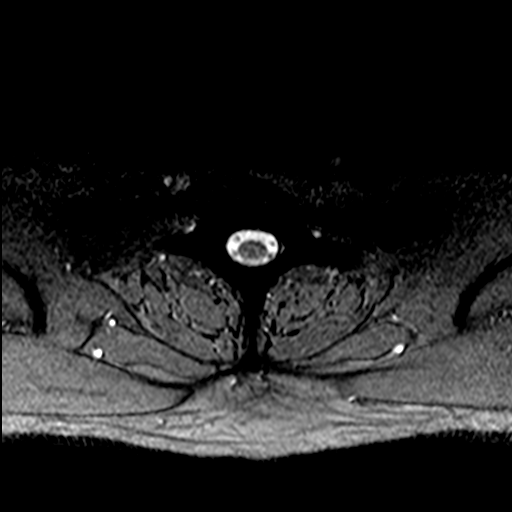
[im 5/27]
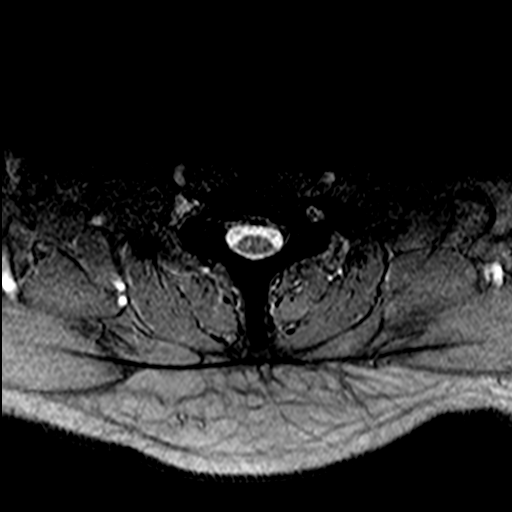
[im 8/27]
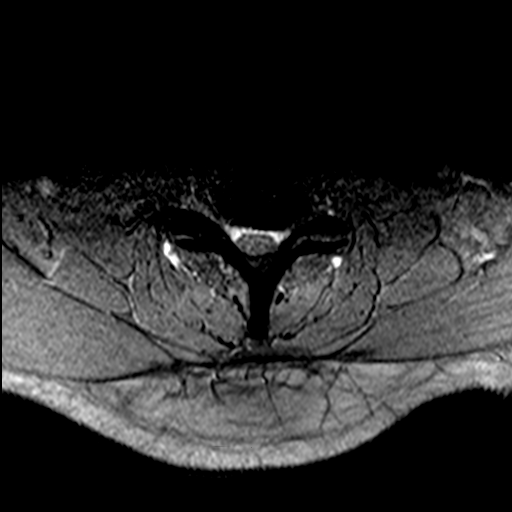

[36 of 48 positions shown; findings below may reference images not displayed]

FINDINGS: Alignment: Physiologic.

Vertebrae: No fracture, evidence of discitis, or bone lesion.

Cord: Normal signal and morphology.

Posterior Fossa, vertebral arteries, paraspinal tissues: Negative.

Disc levels:

C2-C3: Left greater than right uncovertebral arthropathy mildly
narrows the left neural foramen. No canal stenosis.

C3-C4: Mild posterior disc osteophyte complex and bilateral facet
uncovertebral arthropathy result in moderate bilateral foraminal
stenosis. No canal stenosis.

C4-C5: Small posterior disc osteophyte complex and right greater
left facet and uncovertebral arthropathy results mild to moderate
right foraminal stenosis without canal stenosis.

C5-C6: Mild facet and uncovertebral arthropathy without significant
foraminal or canal stenosis.

C6-C7: Posterior disc osteophyte complex and bilateral facet and
uncovertebral arthropathy result in moderate left and mild right
foraminal stenosis with mild canal stenosis.

C7-T1: No significant disc protrusion, foraminal stenosis, or canal
stenosis.
IMPRESSION: Multilevel cervical spondylosis, most pronounced at C6-7 where there
is moderate left and mild right foraminal stenosis as well as mild
canal stenosis.

## 2019-07-11 ENCOUNTER — Telehealth: Payer: Self-pay | Admitting: Family Medicine

## 2019-07-11 ENCOUNTER — Other Ambulatory Visit: Payer: BC Managed Care – PPO

## 2019-07-11 DIAGNOSIS — R202 Paresthesia of skin: Secondary | ICD-10-CM

## 2019-07-11 DIAGNOSIS — M79609 Pain in unspecified limb: Secondary | ICD-10-CM

## 2019-07-11 DIAGNOSIS — G8929 Other chronic pain: Secondary | ICD-10-CM

## 2019-07-11 DIAGNOSIS — M898X1 Other specified disorders of bone, shoulder: Secondary | ICD-10-CM

## 2019-07-11 NOTE — Telephone Encounter (Signed)
I looked at his MRI and he has a little area of spurring/arthritis that is probably pressing on 1 of his nerve roots; I that think this  is causing his posterior shoulder pain.  I do not know that it needs surgery but I would like him to see a neurosurgeon for evaluation.  They might be able to do injection therapy into that area.  I would at least like to have them evaluate him and make sure that is what this is.  If he is okay with it, please set him up with neurosurgery thanks Dorcas Mcmurray

## 2019-07-11 NOTE — Telephone Encounter (Signed)
Referral faxed to [facility]. Pt informed that their office will call him to schedule an appt.

## 2019-07-15 DIAGNOSIS — M47892 Other spondylosis, cervical region: Secondary | ICD-10-CM | POA: Diagnosis not present

## 2019-07-18 ENCOUNTER — Ambulatory Visit: Payer: BC Managed Care – PPO | Admitting: Family Medicine

## 2019-07-24 DIAGNOSIS — L812 Freckles: Secondary | ICD-10-CM | POA: Diagnosis not present

## 2019-07-24 DIAGNOSIS — L814 Other melanin hyperpigmentation: Secondary | ICD-10-CM | POA: Diagnosis not present

## 2019-07-24 DIAGNOSIS — D229 Melanocytic nevi, unspecified: Secondary | ICD-10-CM | POA: Diagnosis not present

## 2019-07-24 DIAGNOSIS — L570 Actinic keratosis: Secondary | ICD-10-CM | POA: Diagnosis not present

## 2019-07-24 DIAGNOSIS — D1801 Hemangioma of skin and subcutaneous tissue: Secondary | ICD-10-CM | POA: Diagnosis not present

## 2019-08-08 DIAGNOSIS — M4802 Spinal stenosis, cervical region: Secondary | ICD-10-CM | POA: Diagnosis not present

## 2019-08-08 DIAGNOSIS — M7918 Myalgia, other site: Secondary | ICD-10-CM | POA: Diagnosis not present

## 2019-08-08 DIAGNOSIS — M47892 Other spondylosis, cervical region: Secondary | ICD-10-CM | POA: Diagnosis not present

## 2019-09-11 DIAGNOSIS — M545 Low back pain: Secondary | ICD-10-CM | POA: Diagnosis not present

## 2019-10-07 DIAGNOSIS — M546 Pain in thoracic spine: Secondary | ICD-10-CM | POA: Diagnosis not present

## 2019-10-07 DIAGNOSIS — M545 Low back pain: Secondary | ICD-10-CM | POA: Diagnosis not present

## 2019-10-07 DIAGNOSIS — R269 Unspecified abnormalities of gait and mobility: Secondary | ICD-10-CM | POA: Diagnosis not present

## 2019-10-14 DIAGNOSIS — M545 Low back pain: Secondary | ICD-10-CM | POA: Diagnosis not present

## 2019-10-14 DIAGNOSIS — R269 Unspecified abnormalities of gait and mobility: Secondary | ICD-10-CM | POA: Diagnosis not present

## 2019-10-14 DIAGNOSIS — M546 Pain in thoracic spine: Secondary | ICD-10-CM | POA: Diagnosis not present

## 2019-10-20 DIAGNOSIS — M546 Pain in thoracic spine: Secondary | ICD-10-CM | POA: Diagnosis not present

## 2019-10-20 DIAGNOSIS — R269 Unspecified abnormalities of gait and mobility: Secondary | ICD-10-CM | POA: Diagnosis not present

## 2019-10-20 DIAGNOSIS — M545 Low back pain: Secondary | ICD-10-CM | POA: Diagnosis not present

## 2019-10-27 DIAGNOSIS — M546 Pain in thoracic spine: Secondary | ICD-10-CM | POA: Diagnosis not present

## 2019-10-27 DIAGNOSIS — R269 Unspecified abnormalities of gait and mobility: Secondary | ICD-10-CM | POA: Diagnosis not present

## 2019-10-27 DIAGNOSIS — M545 Low back pain: Secondary | ICD-10-CM | POA: Diagnosis not present

## 2019-11-03 DIAGNOSIS — M545 Low back pain: Secondary | ICD-10-CM | POA: Diagnosis not present

## 2019-11-03 DIAGNOSIS — R269 Unspecified abnormalities of gait and mobility: Secondary | ICD-10-CM | POA: Diagnosis not present

## 2019-11-03 DIAGNOSIS — M546 Pain in thoracic spine: Secondary | ICD-10-CM | POA: Diagnosis not present

## 2019-11-17 DIAGNOSIS — M545 Low back pain: Secondary | ICD-10-CM | POA: Diagnosis not present

## 2019-11-17 DIAGNOSIS — R269 Unspecified abnormalities of gait and mobility: Secondary | ICD-10-CM | POA: Diagnosis not present

## 2019-11-17 DIAGNOSIS — M546 Pain in thoracic spine: Secondary | ICD-10-CM | POA: Diagnosis not present

## 2019-11-28 DIAGNOSIS — R269 Unspecified abnormalities of gait and mobility: Secondary | ICD-10-CM | POA: Diagnosis not present

## 2019-11-28 DIAGNOSIS — M545 Low back pain: Secondary | ICD-10-CM | POA: Diagnosis not present

## 2019-11-28 DIAGNOSIS — M546 Pain in thoracic spine: Secondary | ICD-10-CM | POA: Diagnosis not present

## 2019-12-05 DIAGNOSIS — R269 Unspecified abnormalities of gait and mobility: Secondary | ICD-10-CM | POA: Diagnosis not present

## 2019-12-05 DIAGNOSIS — M546 Pain in thoracic spine: Secondary | ICD-10-CM | POA: Diagnosis not present

## 2019-12-05 DIAGNOSIS — M545 Low back pain: Secondary | ICD-10-CM | POA: Diagnosis not present

## 2019-12-12 DIAGNOSIS — M545 Low back pain: Secondary | ICD-10-CM | POA: Diagnosis not present

## 2019-12-12 DIAGNOSIS — R269 Unspecified abnormalities of gait and mobility: Secondary | ICD-10-CM | POA: Diagnosis not present

## 2019-12-12 DIAGNOSIS — M546 Pain in thoracic spine: Secondary | ICD-10-CM | POA: Diagnosis not present

## 2019-12-26 DIAGNOSIS — M545 Low back pain: Secondary | ICD-10-CM | POA: Diagnosis not present

## 2019-12-26 DIAGNOSIS — R269 Unspecified abnormalities of gait and mobility: Secondary | ICD-10-CM | POA: Diagnosis not present

## 2019-12-26 DIAGNOSIS — M546 Pain in thoracic spine: Secondary | ICD-10-CM | POA: Diagnosis not present

## 2020-01-09 DIAGNOSIS — M545 Low back pain: Secondary | ICD-10-CM | POA: Diagnosis not present

## 2020-01-09 DIAGNOSIS — R269 Unspecified abnormalities of gait and mobility: Secondary | ICD-10-CM | POA: Diagnosis not present

## 2020-01-09 DIAGNOSIS — M546 Pain in thoracic spine: Secondary | ICD-10-CM | POA: Diagnosis not present

## 2020-01-24 ENCOUNTER — Ambulatory Visit: Payer: Self-pay | Attending: Internal Medicine

## 2020-01-24 DIAGNOSIS — Z23 Encounter for immunization: Secondary | ICD-10-CM

## 2020-01-24 NOTE — Progress Notes (Signed)
   Covid-19 Vaccination Clinic  Name:  Steven Travis    MRN: [id number] DOB: Jun 08, 1971  01/24/2020  Mr. Steven Travis was observed post Covid-19 immunization for 30 minutes based on pre-vaccination screening without incident. He was provided with Vaccine Information Sheet and instruction to access the V-Safe system.   Mr. Steven Travis was instructed to call 911 with any severe reactions post vaccine: Marland Kitchen Difficulty breathing  . Swelling of face and throat  . A fast heartbeat  . A bad rash all over body  . Dizziness and weakness   Immunizations Administered    Name Date Dose VIS Date Route   Pfizer COVID-19 Vaccine 01/24/2020  4:04 PM 0.3 mL 10/03/2019 Intramuscular   Manufacturer: [employer], [employer]   Lot: [id number]   NDC: [address]-8

## 2020-02-06 DIAGNOSIS — Z0000 Encounter for general adult medical examination without abnormal findings: Secondary | ICD-10-CM | POA: Diagnosis not present

## 2020-02-06 DIAGNOSIS — Z1159 Encounter for screening for other viral diseases: Secondary | ICD-10-CM | POA: Diagnosis not present

## 2020-02-06 DIAGNOSIS — Z13220 Encounter for screening for lipoid disorders: Secondary | ICD-10-CM | POA: Diagnosis not present

## 2020-02-06 DIAGNOSIS — Z125 Encounter for screening for malignant neoplasm of prostate: Secondary | ICD-10-CM | POA: Diagnosis not present

## 2020-02-18 ENCOUNTER — Ambulatory Visit: Payer: Self-pay | Attending: Internal Medicine

## 2020-02-18 DIAGNOSIS — Z23 Encounter for immunization: Secondary | ICD-10-CM

## 2020-02-18 NOTE — Progress Notes (Signed)
   Covid-19 Vaccination Clinic  Name:  Steven Travis    MRN: [id number] DOB: 07-07-1971  02/18/2020  [name] was observed post Covid-19 immunization for 15 minutes without incident. He was provided with Vaccine Information Sheet and instruction to access the V-Safe system.   [name] was instructed to call 911 with any severe reactions post vaccine: Marland Kitchen Difficulty breathing  . Swelling of face and throat  . A fast heartbeat  . A bad rash all over body  . Dizziness and weakness   Immunizations Administered    Name Date Dose VIS Date Route   Pfizer COVID-19 Vaccine 02/18/2020  1:09 PM 0.3 mL 12/17/2018 Intramuscular   Manufacturer: [employer], [employer]   Lot: [id number]   NDC: [address]-1

## 2020-06-14 DIAGNOSIS — M7741 Metatarsalgia, right foot: Secondary | ICD-10-CM | POA: Diagnosis not present

## 2020-06-25 DIAGNOSIS — Z1159 Encounter for screening for other viral diseases: Secondary | ICD-10-CM | POA: Diagnosis not present

## 2020-06-30 DIAGNOSIS — Z1211 Encounter for screening for malignant neoplasm of colon: Secondary | ICD-10-CM | POA: Diagnosis not present

## 2020-07-28 DIAGNOSIS — D1801 Hemangioma of skin and subcutaneous tissue: Secondary | ICD-10-CM | POA: Diagnosis not present

## 2020-07-28 DIAGNOSIS — L821 Other seborrheic keratosis: Secondary | ICD-10-CM | POA: Diagnosis not present

## 2020-07-28 DIAGNOSIS — L812 Freckles: Secondary | ICD-10-CM | POA: Diagnosis not present

## 2020-07-28 DIAGNOSIS — L814 Other melanin hyperpigmentation: Secondary | ICD-10-CM | POA: Diagnosis not present

## 2020-07-31 DIAGNOSIS — Z03818 Encounter for observation for suspected exposure to other biological agents ruled out: Secondary | ICD-10-CM | POA: Diagnosis not present

## 2020-07-31 DIAGNOSIS — Z20822 Contact with and (suspected) exposure to covid-19: Secondary | ICD-10-CM | POA: Diagnosis not present

## 2021-02-17 DIAGNOSIS — Z0000 Encounter for general adult medical examination without abnormal findings: Secondary | ICD-10-CM | POA: Diagnosis not present

## 2021-02-17 DIAGNOSIS — Z13220 Encounter for screening for lipoid disorders: Secondary | ICD-10-CM | POA: Diagnosis not present

## 2021-02-17 DIAGNOSIS — Z125 Encounter for screening for malignant neoplasm of prostate: Secondary | ICD-10-CM | POA: Diagnosis not present

## 2021-02-25 DIAGNOSIS — G4719 Other hypersomnia: Secondary | ICD-10-CM | POA: Diagnosis not present

## 2021-03-08 DIAGNOSIS — G4733 Obstructive sleep apnea (adult) (pediatric): Secondary | ICD-10-CM | POA: Diagnosis not present

## 2021-03-16 DIAGNOSIS — G4733 Obstructive sleep apnea (adult) (pediatric): Secondary | ICD-10-CM | POA: Diagnosis not present

## 2021-04-26 DIAGNOSIS — G4733 Obstructive sleep apnea (adult) (pediatric): Secondary | ICD-10-CM | POA: Diagnosis not present

## 2021-05-27 DIAGNOSIS — G4733 Obstructive sleep apnea (adult) (pediatric): Secondary | ICD-10-CM | POA: Diagnosis not present

## 2021-06-03 DIAGNOSIS — G4733 Obstructive sleep apnea (adult) (pediatric): Secondary | ICD-10-CM | POA: Diagnosis not present

## 2021-06-18 DIAGNOSIS — S76312A Strain of muscle, fascia and tendon of the posterior muscle group at thigh level, left thigh, initial encounter: Secondary | ICD-10-CM | POA: Diagnosis not present

## 2021-06-21 DIAGNOSIS — S76312A Strain of muscle, fascia and tendon of the posterior muscle group at thigh level, left thigh, initial encounter: Secondary | ICD-10-CM | POA: Diagnosis not present

## 2021-06-27 DIAGNOSIS — G4733 Obstructive sleep apnea (adult) (pediatric): Secondary | ICD-10-CM | POA: Diagnosis not present

## 2021-07-07 DIAGNOSIS — S76312D Strain of muscle, fascia and tendon of the posterior muscle group at thigh level, left thigh, subsequent encounter: Secondary | ICD-10-CM | POA: Diagnosis not present

## 2021-07-11 DIAGNOSIS — S76012D Strain of muscle, fascia and tendon of left hip, subsequent encounter: Secondary | ICD-10-CM | POA: Diagnosis not present

## 2021-07-12 DIAGNOSIS — S76012D Strain of muscle, fascia and tendon of left hip, subsequent encounter: Secondary | ICD-10-CM | POA: Diagnosis not present

## 2021-07-19 DIAGNOSIS — S76012D Strain of muscle, fascia and tendon of left hip, subsequent encounter: Secondary | ICD-10-CM | POA: Diagnosis not present

## 2021-07-19 DIAGNOSIS — G4733 Obstructive sleep apnea (adult) (pediatric): Secondary | ICD-10-CM | POA: Diagnosis not present

## 2021-07-25 DIAGNOSIS — G4733 Obstructive sleep apnea (adult) (pediatric): Secondary | ICD-10-CM | POA: Diagnosis not present

## 2021-07-27 DIAGNOSIS — G4733 Obstructive sleep apnea (adult) (pediatric): Secondary | ICD-10-CM | POA: Diagnosis not present

## 2021-08-04 DIAGNOSIS — S40912A Unspecified superficial injury of left shoulder, initial encounter: Secondary | ICD-10-CM | POA: Diagnosis not present

## 2021-08-04 DIAGNOSIS — L570 Actinic keratosis: Secondary | ICD-10-CM | POA: Diagnosis not present

## 2021-08-04 DIAGNOSIS — L814 Other melanin hyperpigmentation: Secondary | ICD-10-CM | POA: Diagnosis not present

## 2021-08-04 DIAGNOSIS — L821 Other seborrheic keratosis: Secondary | ICD-10-CM | POA: Diagnosis not present

## 2021-08-04 DIAGNOSIS — D225 Melanocytic nevi of trunk: Secondary | ICD-10-CM | POA: Diagnosis not present

## 2021-08-04 DIAGNOSIS — S76012D Strain of muscle, fascia and tendon of left hip, subsequent encounter: Secondary | ICD-10-CM | POA: Diagnosis not present

## 2021-08-18 DIAGNOSIS — S76012D Strain of muscle, fascia and tendon of left hip, subsequent encounter: Secondary | ICD-10-CM | POA: Diagnosis not present

## 2021-09-01 DIAGNOSIS — S76012D Strain of muscle, fascia and tendon of left hip, subsequent encounter: Secondary | ICD-10-CM | POA: Diagnosis not present

## 2021-09-27 DIAGNOSIS — S76012D Strain of muscle, fascia and tendon of left hip, subsequent encounter: Secondary | ICD-10-CM | POA: Diagnosis not present

## 2021-10-06 DIAGNOSIS — S76012D Strain of muscle, fascia and tendon of left hip, subsequent encounter: Secondary | ICD-10-CM | POA: Diagnosis not present

## 2021-10-19 DIAGNOSIS — S76012D Strain of muscle, fascia and tendon of left hip, subsequent encounter: Secondary | ICD-10-CM | POA: Diagnosis not present

## 2021-11-09 DIAGNOSIS — S76012D Strain of muscle, fascia and tendon of left hip, subsequent encounter: Secondary | ICD-10-CM | POA: Diagnosis not present

## 2021-11-23 DIAGNOSIS — S76012D Strain of muscle, fascia and tendon of left hip, subsequent encounter: Secondary | ICD-10-CM | POA: Diagnosis not present

## 2021-12-07 DIAGNOSIS — R109 Unspecified abdominal pain: Secondary | ICD-10-CM | POA: Diagnosis not present

## 2021-12-20 DIAGNOSIS — G4733 Obstructive sleep apnea (adult) (pediatric): Secondary | ICD-10-CM | POA: Diagnosis not present

## 2021-12-21 DIAGNOSIS — S76012D Strain of muscle, fascia and tendon of left hip, subsequent encounter: Secondary | ICD-10-CM | POA: Diagnosis not present

## 2022-01-19 DIAGNOSIS — G4733 Obstructive sleep apnea (adult) (pediatric): Secondary | ICD-10-CM | POA: Diagnosis not present

## 2022-01-25 DIAGNOSIS — S76012D Strain of muscle, fascia and tendon of left hip, subsequent encounter: Secondary | ICD-10-CM | POA: Diagnosis not present

## 2022-02-09 DIAGNOSIS — L821 Other seborrheic keratosis: Secondary | ICD-10-CM | POA: Diagnosis not present

## 2022-02-09 DIAGNOSIS — L570 Actinic keratosis: Secondary | ICD-10-CM | POA: Diagnosis not present

## 2022-02-09 DIAGNOSIS — L814 Other melanin hyperpigmentation: Secondary | ICD-10-CM | POA: Diagnosis not present

## 2022-02-09 DIAGNOSIS — L218 Other seborrheic dermatitis: Secondary | ICD-10-CM | POA: Diagnosis not present

## 2022-02-09 DIAGNOSIS — D225 Melanocytic nevi of trunk: Secondary | ICD-10-CM | POA: Diagnosis not present

## 2022-02-23 DIAGNOSIS — E7800 Pure hypercholesterolemia, unspecified: Secondary | ICD-10-CM | POA: Diagnosis not present

## 2022-02-23 DIAGNOSIS — G4733 Obstructive sleep apnea (adult) (pediatric): Secondary | ICD-10-CM | POA: Diagnosis not present

## 2022-02-23 DIAGNOSIS — Z23 Encounter for immunization: Secondary | ICD-10-CM | POA: Diagnosis not present

## 2022-02-23 DIAGNOSIS — Z0001 Encounter for general adult medical examination with abnormal findings: Secondary | ICD-10-CM | POA: Diagnosis not present

## 2022-02-23 DIAGNOSIS — H9312 Tinnitus, left ear: Secondary | ICD-10-CM | POA: Diagnosis not present

## 2022-02-23 DIAGNOSIS — Z125 Encounter for screening for malignant neoplasm of prostate: Secondary | ICD-10-CM | POA: Diagnosis not present

## 2022-02-28 DIAGNOSIS — H93292 Other abnormal auditory perceptions, left ear: Secondary | ICD-10-CM | POA: Diagnosis not present

## 2022-02-28 DIAGNOSIS — H9312 Tinnitus, left ear: Secondary | ICD-10-CM | POA: Diagnosis not present

## 2022-02-28 DIAGNOSIS — J310 Chronic rhinitis: Secondary | ICD-10-CM | POA: Diagnosis not present

## 2022-02-28 DIAGNOSIS — J343 Hypertrophy of nasal turbinates: Secondary | ICD-10-CM | POA: Diagnosis not present

## 2022-03-25 DIAGNOSIS — S91201A Unspecified open wound of right great toe with damage to nail, initial encounter: Secondary | ICD-10-CM | POA: Diagnosis not present

## 2022-03-28 ENCOUNTER — Other Ambulatory Visit: Payer: Self-pay | Admitting: Otolaryngology

## 2022-03-28 DIAGNOSIS — H9312 Tinnitus, left ear: Secondary | ICD-10-CM

## 2022-04-06 ENCOUNTER — Ambulatory Visit
Admission: RE | Admit: 2022-04-06 | Discharge: 2022-04-06 | Disposition: A | Discharge status: Home / Self Care | Payer: BC Managed Care – PPO | Source: Ambulatory Visit | Attending: Otolaryngology | Admitting: Otolaryngology

## 2022-04-06 DIAGNOSIS — H9312 Tinnitus, left ear: Secondary | ICD-10-CM

## 2022-04-06 IMAGING — MR MR HEAD WO/W CM
11 of 12 series · 46 of 48 positions shown · IV contrast (multihance)
Comparison: None.

CLINICAL DATA: Provided history: Tinnitus of left ear. Additional
history provided by scanning technologist: Patient reports tinnitus,
"Whooshing" sound with heartbeat.

EXAM:
MRI HEAD WITHOUT AND WITH CONTRAST
TECHNIQUE: Multiplanar, multiecho pulse sequences of the brain and surrounding
structures were obtained without and with intravenous contrast.
CONTRAST:  18mL MULTIHANCE GADOBENATE DIMEGLUMINE 529 MG/ML IV SOLN

[Series 6: DWI · axial · 3.0mm · 0.94mm/px · z∈[-73,+84]mm · 9 of 180 slices shown]
[im 1/180]
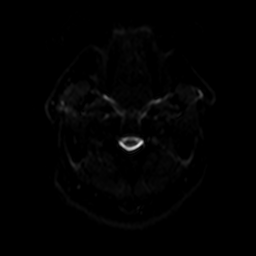
[im 23/180]
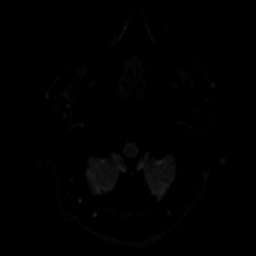
[im 45/180]
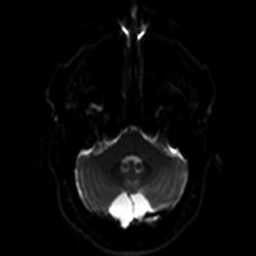
[im 68/180]
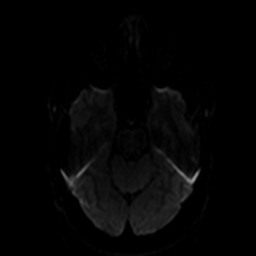
[im 90/180]
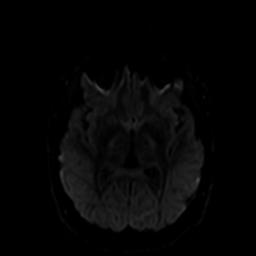
[im 112/180]
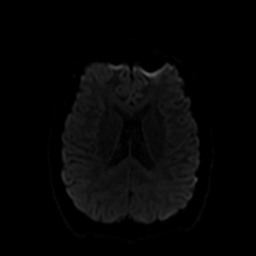
[im 135/180]
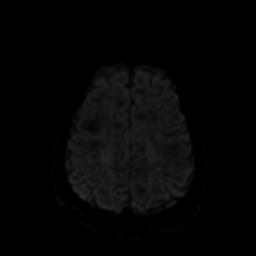
[im 157/180]
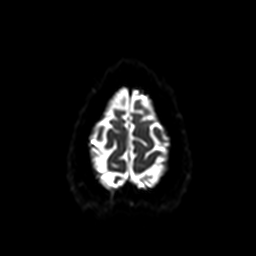
[im 180/180]
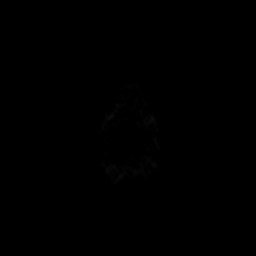

[Series 7: ax dwi_tracew · axial · 3.0mm · 0.94mm/px · z∈[-73,+84]mm · 5 of 90 slices shown]
[im 1/90]
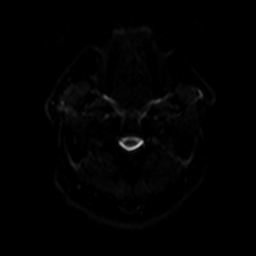
[im 23/90]
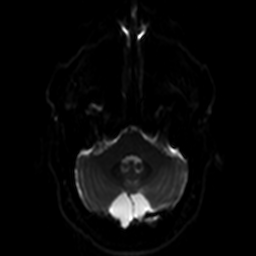
[im 45/90]
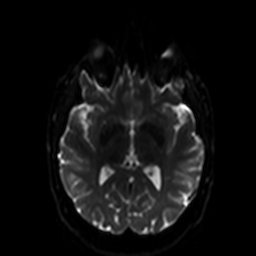
[im 67/90]
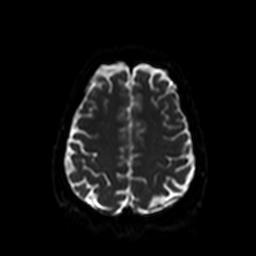
[im 90/90]
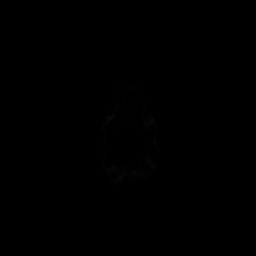

[Series 8: ax dwi_adc · axial · 3.0mm · 0.94mm/px · z∈[-73,+84]mm · 2 of 45 slices shown]
[im 1/45]
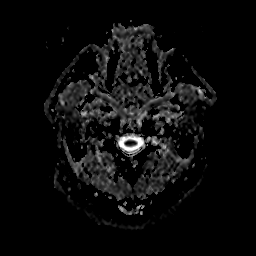
[im 45/45]
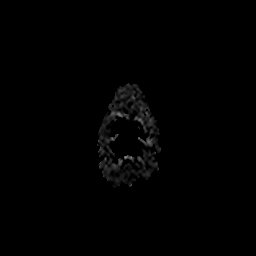

[Series 9: T1 · sagittal · 4.0mm · 0.75mm/px · 2 of 31 slices shown (1 of 3)]
[im 1/31]
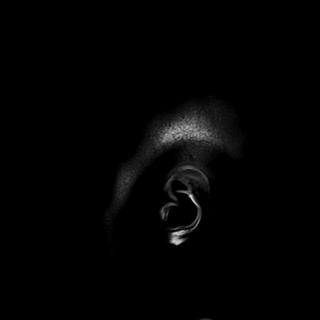
[im 31/31]
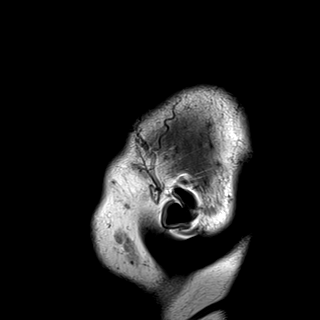

[Series 10: T2 · axial · 4.0mm · 0.36mm/px · z∈[-63,+98]mm · 2 of 32 slices shown]
[im 1/32]
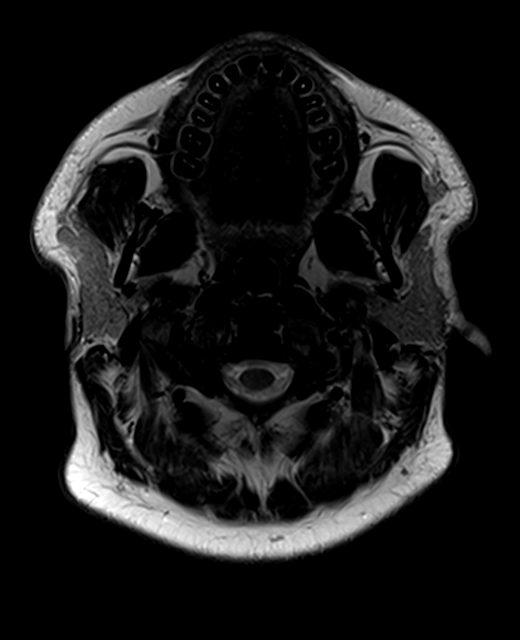
[im 32/32]
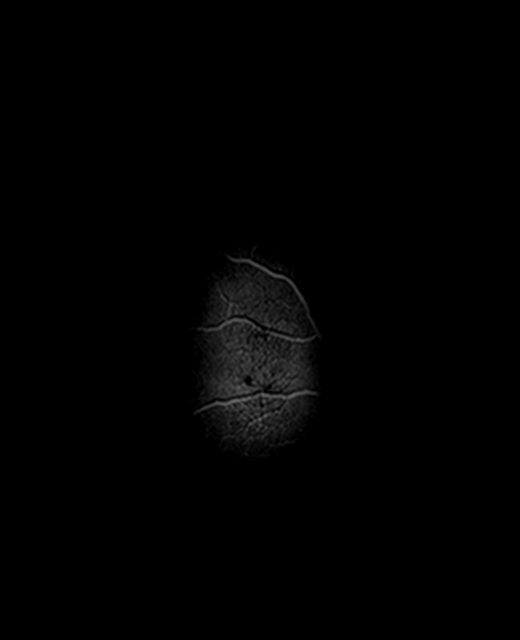

[Series 11: FLAIR · axial · 3.0mm · 0.72mm/px · 1 of 26 slices shown]
[im 1/26]
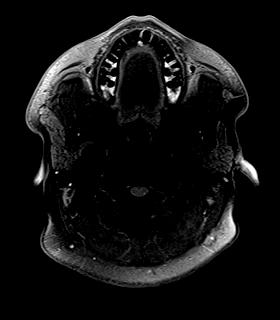

[Series 13: swi_images · axial · 3.0mm · 0.90mm/px · z∈[-64,+100]mm · 3 of 56 slices shown]
[im 1/56]
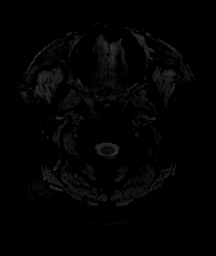
[im 28/56]
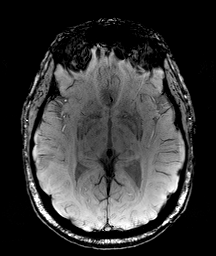
[im 56/56]
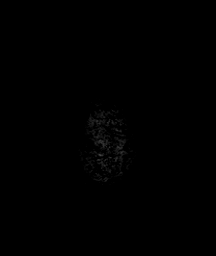

[Series 15: T1 · axial · 1.0mm · 0.90mm/px · z∈[-71,+87]mm · 9 of 160 slices shown (2 of 3)]
[im 1/160]
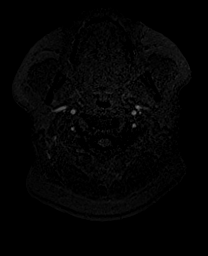
[im 20/160]
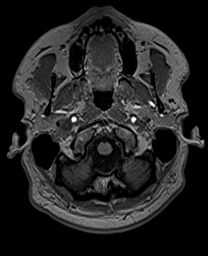
[im 40/160]
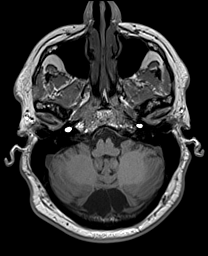
[im 60/160]
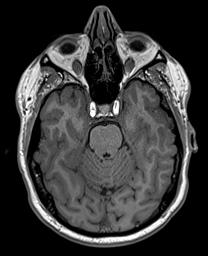
[im 80/160]
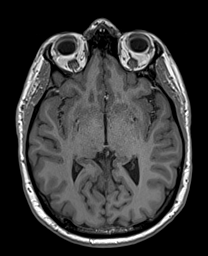
[im 100/160]
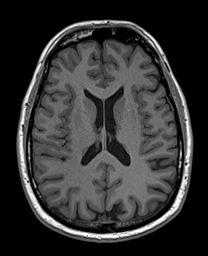
[im 120/160]
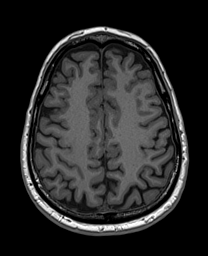
[im 140/160]
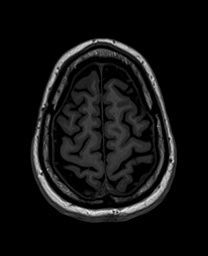
[im 160/160]
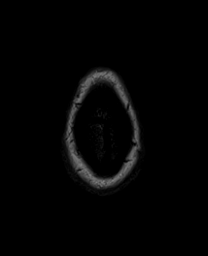

[Series 16: T2 post-contrast · coronal · 4.5mm · 0.36mm/px · 2 of 35 slices shown]
[im 1/35]
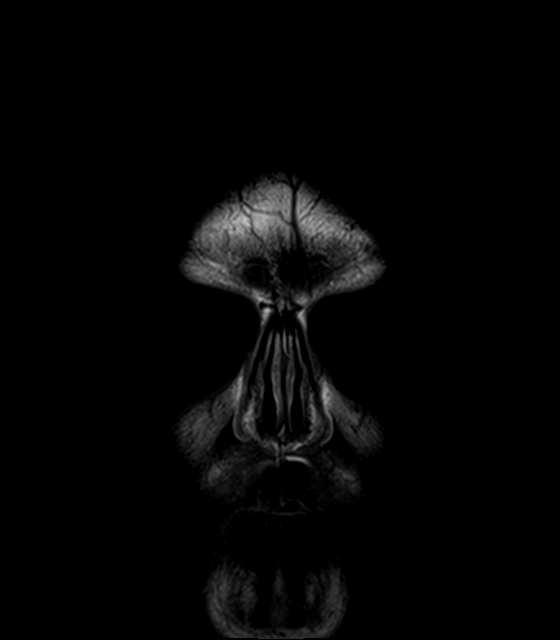
[im 35/35]
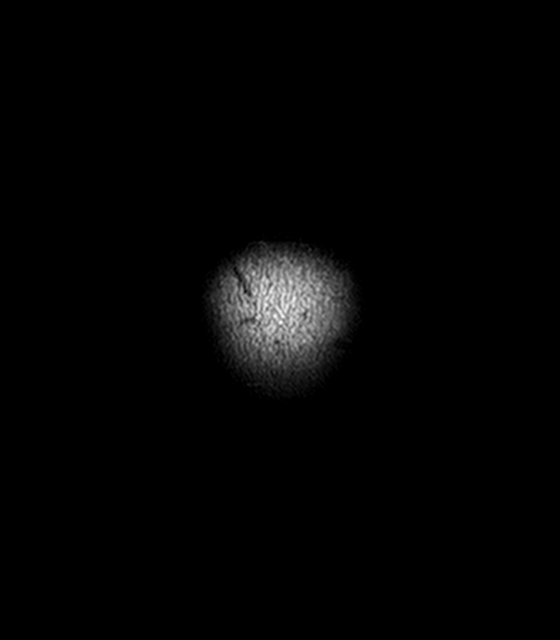

[Series 17: T1 · axial · 1.0mm · 0.90mm/px · z∈[-71,+87]mm · 9 of 160 slices shown (3 of 3)]
[im 1/160]
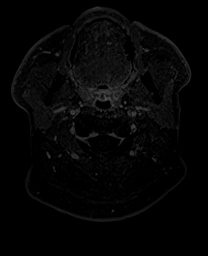
[im 20/160]
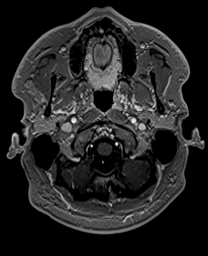
[im 40/160]
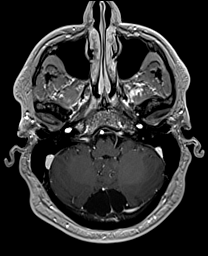
[im 60/160]
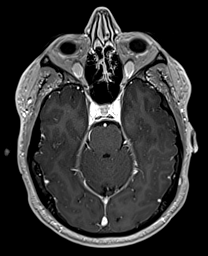
[im 80/160]
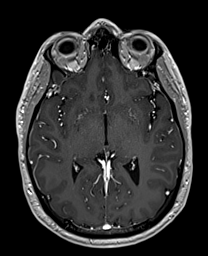
[im 100/160]
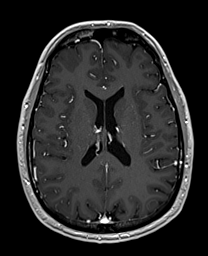
[im 120/160]
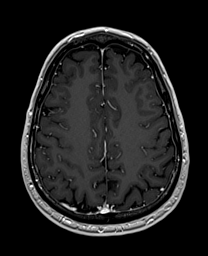
[im 140/160]
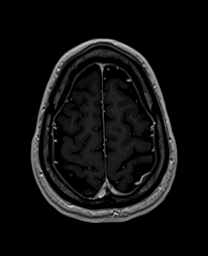
[im 160/160]
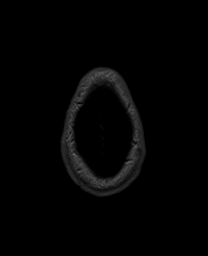

[Series 18: T1 post-contrast · coronal · 4.5mm · 0.72mm/px · 2 of 35 slices shown]
[im 1/35]
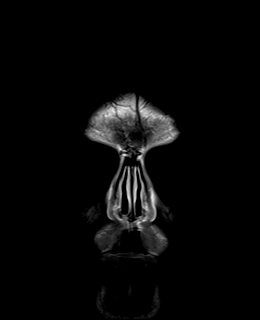
[im 35/35]
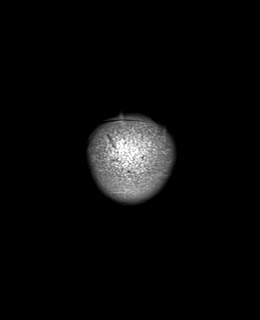

[46 of 48 positions shown; findings below may reference images not displayed]

FINDINGS: Brain:

Cerebral volume is normal.

Retrocerebellar CSF intensity prominence measuring 1.8 x 3.5 cm (AP
x TV). This may reflect a mega cisterna magna or posterior fossa
arachnoid cyst.

No cortical encephalomalacia is identified. No significant cerebral
white matter disease.

No evidence of an intracranial mass. Specifically, no
cerebellopontine angle or internal auditory canal mass is
demonstrated. Unremarkable appearance of the 7th and 8th cranial
nerves bilaterally.

There is no acute infarct.

No chronic intracranial blood products.

No midline shift.

No pathologic intracranial enhancement identified.

Vascular: Maintained flow voids within the proximal large arterial
vessels. Small jugular bulb diverticulum on the left (series 101,
image 27).

Skull and upper cervical spine: No focal suspicious marrow lesion.

Sinuses/Orbits: No mass or acute finding within the imaged orbits.
Trace mucosal thickening within the right frontal and bilateral
ethmoid sinuses.
IMPRESSION: 1. No evidence of acute intracranial abnormality.
2. No cerebellopontine angle or internal auditory canal mass.
3. Small jugular bulb diverticulum on the left.
4. Mega cisterna magna versus posterior fossa arachnoid cyst.
5. Unremarkable MRI appearance of the brain itself.

## 2022-04-06 MED ORDER — GADOBENATE DIMEGLUMINE 529 MG/ML IV SOLN
18.0000 mL | Freq: Once | INTRAVENOUS | Status: AC | PRN
  Administered 2022-04-06: 18 mL via INTRAVENOUS

## 2022-04-09 ENCOUNTER — Other Ambulatory Visit: Payer: Self-pay

## 2022-08-14 DIAGNOSIS — L821 Other seborrheic keratosis: Secondary | ICD-10-CM | POA: Diagnosis not present

## 2022-08-14 DIAGNOSIS — D225 Melanocytic nevi of trunk: Secondary | ICD-10-CM | POA: Diagnosis not present

## 2022-08-14 DIAGNOSIS — L570 Actinic keratosis: Secondary | ICD-10-CM | POA: Diagnosis not present

## 2022-08-14 DIAGNOSIS — L814 Other melanin hyperpigmentation: Secondary | ICD-10-CM | POA: Diagnosis not present

## 2022-09-07 DIAGNOSIS — Z23 Encounter for immunization: Secondary | ICD-10-CM | POA: Diagnosis not present

## 2023-01-22 DIAGNOSIS — J309 Allergic rhinitis, unspecified: Secondary | ICD-10-CM | POA: Diagnosis not present

## 2023-01-22 DIAGNOSIS — G4733 Obstructive sleep apnea (adult) (pediatric): Secondary | ICD-10-CM | POA: Diagnosis not present

## 2023-02-08 DIAGNOSIS — S76312D Strain of muscle, fascia and tendon of the posterior muscle group at thigh level, left thigh, subsequent encounter: Secondary | ICD-10-CM | POA: Diagnosis not present

## 2023-02-15 DIAGNOSIS — L578 Other skin changes due to chronic exposure to nonionizing radiation: Secondary | ICD-10-CM | POA: Diagnosis not present

## 2023-03-01 DIAGNOSIS — Z0000 Encounter for general adult medical examination without abnormal findings: Secondary | ICD-10-CM | POA: Diagnosis not present

## 2023-03-01 DIAGNOSIS — Z125 Encounter for screening for malignant neoplasm of prostate: Secondary | ICD-10-CM | POA: Diagnosis not present

## 2023-03-01 DIAGNOSIS — R799 Abnormal finding of blood chemistry, unspecified: Secondary | ICD-10-CM | POA: Diagnosis not present

## 2023-03-01 DIAGNOSIS — E7800 Pure hypercholesterolemia, unspecified: Secondary | ICD-10-CM | POA: Diagnosis not present

## 2023-03-01 DIAGNOSIS — G5601 Carpal tunnel syndrome, right upper limb: Secondary | ICD-10-CM | POA: Diagnosis not present

## 2023-03-01 DIAGNOSIS — M7712 Lateral epicondylitis, left elbow: Secondary | ICD-10-CM | POA: Diagnosis not present

## 2023-04-18 DIAGNOSIS — G4733 Obstructive sleep apnea (adult) (pediatric): Secondary | ICD-10-CM | POA: Diagnosis not present

## 2023-06-12 DIAGNOSIS — M7712 Lateral epicondylitis, left elbow: Secondary | ICD-10-CM | POA: Diagnosis not present

## 2023-09-05 DIAGNOSIS — L570 Actinic keratosis: Secondary | ICD-10-CM | POA: Diagnosis not present

## 2023-09-05 DIAGNOSIS — D225 Melanocytic nevi of trunk: Secondary | ICD-10-CM | POA: Diagnosis not present

## 2023-09-05 DIAGNOSIS — L821 Other seborrheic keratosis: Secondary | ICD-10-CM | POA: Diagnosis not present

## 2023-09-05 DIAGNOSIS — L814 Other melanin hyperpigmentation: Secondary | ICD-10-CM | POA: Diagnosis not present

## 2023-11-20 DIAGNOSIS — K219 Gastro-esophageal reflux disease without esophagitis: Secondary | ICD-10-CM | POA: Diagnosis not present

## 2023-11-20 DIAGNOSIS — J069 Acute upper respiratory infection, unspecified: Secondary | ICD-10-CM | POA: Diagnosis not present

## 2024-03-13 DIAGNOSIS — L570 Actinic keratosis: Secondary | ICD-10-CM | POA: Diagnosis not present

## 2024-03-13 DIAGNOSIS — D225 Melanocytic nevi of trunk: Secondary | ICD-10-CM | POA: Diagnosis not present

## 2024-03-13 DIAGNOSIS — L814 Other melanin hyperpigmentation: Secondary | ICD-10-CM | POA: Diagnosis not present

## 2024-03-13 DIAGNOSIS — L821 Other seborrheic keratosis: Secondary | ICD-10-CM | POA: Diagnosis not present

## 2024-04-24 DIAGNOSIS — M25511 Pain in right shoulder: Secondary | ICD-10-CM | POA: Diagnosis not present

## 2024-04-24 DIAGNOSIS — Z125 Encounter for screening for malignant neoplasm of prostate: Secondary | ICD-10-CM | POA: Diagnosis not present

## 2024-04-24 DIAGNOSIS — Z91038 Other insect allergy status: Secondary | ICD-10-CM | POA: Diagnosis not present

## 2024-04-24 DIAGNOSIS — E7800 Pure hypercholesterolemia, unspecified: Secondary | ICD-10-CM | POA: Diagnosis not present

## 2024-04-24 DIAGNOSIS — Z0000 Encounter for general adult medical examination without abnormal findings: Secondary | ICD-10-CM | POA: Diagnosis not present
